# Patient Record
Sex: Female | Born: 1995 | Race: White | Hispanic: No | Marital: Single | Smoking: Never smoker
Health system: Southern US, Community
[De-identification: ages and names within clinical notes are randomized; demographics above are authoritative.]

## PROBLEM LIST (undated history)

## (undated) DIAGNOSIS — F419 Anxiety disorder, unspecified: Secondary | ICD-10-CM

---

## 2016-11-19 ENCOUNTER — Encounter: Payer: Self-pay | Admitting: Physician Assistant

## 2016-11-29 ENCOUNTER — Ambulatory Visit: Payer: Self-pay | Admitting: Physician Assistant

## 2016-12-06 ENCOUNTER — Ambulatory Visit (INDEPENDENT_AMBULATORY_CARE_PROVIDER_SITE_OTHER): Payer: BC Managed Care – PPO | Admitting: Gastroenterology

## 2016-12-06 ENCOUNTER — Encounter: Payer: Self-pay | Admitting: Gastroenterology

## 2016-12-06 VITALS — BP 133/81 | HR 75 | Temp 98.1°F | Ht 63.0 in | Wt 137.0 lb

## 2016-12-06 DIAGNOSIS — R140 Abdominal distension (gaseous): Secondary | ICD-10-CM | POA: Diagnosis not present

## 2016-12-06 DIAGNOSIS — R1011 Right upper quadrant pain: Secondary | ICD-10-CM | POA: Diagnosis not present

## 2016-12-06 DIAGNOSIS — K625 Hemorrhage of anus and rectum: Secondary | ICD-10-CM | POA: Diagnosis not present

## 2016-12-06 DIAGNOSIS — G8929 Other chronic pain: Secondary | ICD-10-CM

## 2016-12-06 NOTE — Patient Instructions (Signed)
Please refer below for you upcoming appointments. Call us if you have any questions or concerns

## 2016-12-06 NOTE — Progress Notes (Signed)
Gastroenterology Consultation  Referring Provider:     No ref. provider found Primary Care Physician:  No primary care provider on file. Primary Gastroenterologist:  Dr. [name] Reida Hem  Reason for Consultation:     Abdominal pain         HPI:   Beryle QuantRebeckah Eichenberger is a 20 y.o. y/o female referred for consultation & management  by Dr. Bonnetta BarryNo primary care provider on file..    Abdominal pain: Onset: She says that it has been onging for a month, began all of a sudden , when it occurs stays for a long time  Site :RUQ Radiation: radiates to RLQ Severity :5/10 Nature of pain: sharp in nature Aggravating factors: Usually worst after she eats, seconds after she eats,  Relieving factors :passing gas helps, bowel movement helps  Weight loss: lost 8 lbs over the past month, she says the "food goes right thorugh me" NSAID use: Was on alleve which she stopped 3-4 weeks back , till then 2 tablets a day every day for a week  PPI use :prilosec, has helped her pain , before meals , 20 mg  Gall bladder surgery: intact , ?mom had gall bladder issues  Frequency of bowel movements: since starting prilosec once daily and prior to that was 5 times a day right after her meals  Change in bowel movements: she saw blood in her stool , mixed in her stool 1 week back , great grandmother had a "bag" Relief with bowel movements: yes  Gas/Bloating/Abdominal distension: yes, abdominal distension , "appears pregnant".   Does not drink soda, no artificial sweetners, consumes a lot of pizza pockets , taco roles, lots of processed foods..Does notice more abdominal distension when she consumes processed foods. When she passes gas is very foul smelling .    Presently stool consistency of mashed potatoes.  History reviewed. No pertinent past medical history.  History reviewed. No pertinent surgical history.  Prior to Admission medications   Medication Sig Start Date End Date Taking? Authorizing Provider  FLUoxetine (PROZAC) 40  MG capsule  11/22/16  Yes Historical Provider, MD  omeprazole (PRILOSEC) 20 MG capsule Take 20 mg by mouth daily.   Yes Historical Provider, MD    Family History  Problem Relation Age of Onset  . Stroke Maternal Grandfather   . Healthy Mother   . Healthy Father      Social History  Substance Use Topics  . Smoking status: Never Smoker  . Smokeless tobacco: Never Used  . Alcohol use No    Allergies as of 12/06/2016  . (No Known Allergies)    Review of Systems:    All systems reviewed and negative except where noted in HPI.   Physical Exam:  BP 133/81   Pulse 75   Temp 98.1 F (36.7 C) (Oral)   Ht 5\' 3"  (1.6 m)   Wt 137 lb (62.1 kg)   BMI 24.27 kg/m  No LMP recorded. Psych:  Alert and cooperative. Normal mood and affect. General:   Alert,  Well-developed, well-nourished, pleasant and cooperative in NAD Head:  Normocephalic and atraumatic. Eyes:  Sclera clear, no icterus.   Conjunctiva pink. Ears:  Normal auditory acuity. Nose:  No deformity, discharge, or lesions. Mouth:  No deformity or lesions,oropharynx pink & moist. Neck:  Supple; no masses or thyromegaly. Lungs:  Respirations even and unlabored.  Clear throughout to auscultation.   No wheezes, crackles, or rhonchi. No acute distress. Heart:  Regular rate and rhythm; no murmurs, clicks, rubs,  or gallops. Abdomen:  Normal bowel sounds.  No bruits.  Soft, non-tender and non-distended without masses, hepatosplenomegaly or hernias noted.  No guarding or rebound tenderness.    Psych:  Alert and cooperative. Normal mood and affect.  Imaging Studies: No results found.  Assessment and Plan:   Beryle QuantRebeckah Trentham is a 20 y.o. y/o female has been referred for abdominal pain in her RUQ, she also mentions intermittent rectal bleeding , gas and bloating .   She has some relief with PPI. Differentials for pain are gall stones, trapping of gas from carbohydrate intolerance. Rectal bleeding could be from hemorroids  1. Abdominal  pain: Increase dose of Prilosec , RUQ USG, Will obtain EGD as pain not relieved completely with PPI  2. Rectal bleeding : colonoscopy   3. Gas/bloating : Decrease dietary consumption of foods rich in sugars and high fructose corn syrup, if all tests are negative will consider treating empirically for small bowel bacterial overgrowth sydrome. Check celiac panel and TSH   Follow up in 6 weeks   Dr [name] Demetre Monaco MD

## 2016-12-07 ENCOUNTER — Other Ambulatory Visit
Admission: RE | Admit: 2016-12-07 | Discharge: 2016-12-07 | Disposition: A | Discharge status: Home / Self Care | Payer: BC Managed Care – PPO | Source: Ambulatory Visit | Attending: Gastroenterology | Admitting: Gastroenterology

## 2016-12-07 ENCOUNTER — Telehealth: Payer: Self-pay

## 2016-12-07 DIAGNOSIS — R1011 Right upper quadrant pain: Secondary | ICD-10-CM | POA: Insufficient documentation

## 2016-12-07 DIAGNOSIS — G8929 Other chronic pain: Secondary | ICD-10-CM | POA: Insufficient documentation

## 2016-12-07 DIAGNOSIS — R140 Abdominal distension (gaseous): Secondary | ICD-10-CM | POA: Insufficient documentation

## 2016-12-07 LAB — TSH: TSH: 1.373 u[IU]/mL (ref 0.350–4.500)

## 2016-12-07 NOTE — Telephone Encounter (Signed)
Toniann FailWendy, Patients mother, is concerned because the EGD and Colonoscopy is going to cost her 2 house payments. She is wanting to know if this is necessary and what exactly you are looking for. She has requested that you contact her at [phone] to discuss this with her. Please let me know if there is anything I can do for you.

## 2016-12-08 NOTE — Telephone Encounter (Signed)
Pt's mother, Toniann FailWendy stated she can speak with you anytime tomorrow. I told her you would contact her late morning.

## 2016-12-08 NOTE — Telephone Encounter (Signed)
Called mother , went to [employer], advised to inform us what time she would be free tomorrow to take my call

## 2016-12-09 LAB — H. PYLORI ANTIGEN, STOOL: H. Pylori Stool Ag, Eia: NEGATIVE

## 2016-12-09 NOTE — Telephone Encounter (Signed)
Spoke to LinthicumWendy explained that rectal bleeding requires evaluation . I did offer assistance in terms of providing medical notes or a letter if it would help the insurance company . She says she will get in touch with the insurance and call us if needed.   At present she will go ahead with the procedures as scheduled.   Dr [name] Selia Wareing  Gastroenterology/Hepatology Pager: [phone]

## 2016-12-10 ENCOUNTER — Ambulatory Visit
Admission: RE | Admit: 2016-12-10 | Discharge: 2016-12-10 | Disposition: A | Discharge status: Home / Self Care | Payer: BC Managed Care – PPO | Source: Ambulatory Visit | Attending: Gastroenterology | Admitting: Gastroenterology

## 2016-12-10 DIAGNOSIS — G8929 Other chronic pain: Secondary | ICD-10-CM | POA: Insufficient documentation

## 2016-12-10 DIAGNOSIS — R1011 Right upper quadrant pain: Secondary | ICD-10-CM | POA: Insufficient documentation

## 2016-12-10 LAB — CELIAC DISEASE PANEL
Endomysial Ab, IgA: NEGATIVE
IgA: 126 mg/dL (ref 87–352)
Tissue Transglutaminase Ab, IgA: <2 U/mL (ref 0–3)

## 2016-12-14 ENCOUNTER — Telehealth: Payer: Self-pay | Admitting: Gastroenterology

## 2016-12-14 NOTE — Telephone Encounter (Signed)
Results from ultrasound

## 2016-12-14 NOTE — Telephone Encounter (Signed)
Patient notified of  of Ultrasound 12/10/16.  Normal exam.

## 2016-12-15 ENCOUNTER — Encounter: Payer: Self-pay | Admitting: *Deleted

## 2016-12-16 ENCOUNTER — Ambulatory Visit
Admission: RE | Admit: 2016-12-16 | Discharge: 2016-12-16 | Disposition: A | Discharge status: Home / Self Care | Payer: BC Managed Care – PPO | Source: Ambulatory Visit | Attending: Gastroenterology | Admitting: Gastroenterology

## 2016-12-16 ENCOUNTER — Ambulatory Visit: Payer: BC Managed Care – PPO | Admitting: Certified Registered Nurse Anesthetist

## 2016-12-16 ENCOUNTER — Encounter
Admission: RE | Disposition: A | Discharge status: Home / Self Care | Payer: Self-pay | Source: Ambulatory Visit | Attending: Gastroenterology

## 2016-12-16 ENCOUNTER — Encounter: Payer: Self-pay | Admitting: *Deleted

## 2016-12-16 DIAGNOSIS — F419 Anxiety disorder, unspecified: Secondary | ICD-10-CM | POA: Diagnosis not present

## 2016-12-16 DIAGNOSIS — K3189 Other diseases of stomach and duodenum: Secondary | ICD-10-CM | POA: Insufficient documentation

## 2016-12-16 DIAGNOSIS — K625 Hemorrhage of anus and rectum: Secondary | ICD-10-CM | POA: Diagnosis not present

## 2016-12-16 DIAGNOSIS — R109 Unspecified abdominal pain: Secondary | ICD-10-CM | POA: Diagnosis present

## 2016-12-16 DIAGNOSIS — K640 First degree hemorrhoids: Secondary | ICD-10-CM | POA: Diagnosis not present

## 2016-12-16 HISTORY — PX: COLONOSCOPY WITH PROPOFOL: SHX5780

## 2016-12-16 HISTORY — PX: ESOPHAGOGASTRODUODENOSCOPY (EGD) WITH PROPOFOL: SHX5813

## 2016-12-16 HISTORY — DX: Anxiety disorder, unspecified: F41.9

## 2016-12-16 LAB — POCT PREGNANCY, URINE: Preg Test, Ur: NEGATIVE

## 2016-12-16 SURGERY — COLONOSCOPY WITH PROPOFOL
Anesthesia: General

## 2016-12-16 MED ORDER — PROPOFOL 10 MG/ML IV BOLUS
INTRAVENOUS | Status: DC | PRN
  Administered 2016-12-16: 80 mg via INTRAVENOUS
  Administered 2016-12-16: 20 mg via INTRAVENOUS

## 2016-12-16 MED ORDER — LIDOCAINE HCL (CARDIAC) 20 MG/ML IV SOLN
INTRAVENOUS | Status: DC | PRN
  Administered 2016-12-16: 100 mg via INTRAVENOUS

## 2016-12-16 MED ORDER — PROPOFOL 500 MG/50ML IV EMUL
INTRAVENOUS | Status: AC
  Filled 2016-12-16: qty 50

## 2016-12-16 MED ORDER — SODIUM CHLORIDE 0.9 % IV SOLN
INTRAVENOUS | Status: DC
  Administered 2016-12-16: 08:00:00 via INTRAVENOUS

## 2016-12-16 MED ORDER — FENTANYL CITRATE (PF) 100 MCG/2ML IJ SOLN
INTRAMUSCULAR | Status: AC
  Filled 2016-12-16: qty 2

## 2016-12-16 MED ORDER — FENTANYL CITRATE (PF) 100 MCG/2ML IJ SOLN
INTRAMUSCULAR | Status: DC | PRN
  Administered 2016-12-16: 50 ug via INTRAVENOUS

## 2016-12-16 MED ORDER — PROPOFOL 500 MG/50ML IV EMUL
INTRAVENOUS | Status: DC | PRN
  Administered 2016-12-16: 140 ug/kg/min via INTRAVENOUS

## 2016-12-16 MED ORDER — LIDOCAINE 2% (20 MG/ML) 5 ML SYRINGE
INTRAMUSCULAR | Status: AC
  Filled 2016-12-16: qty 5

## 2016-12-16 NOTE — H&P (Signed)
  Anna Cook Trinity Hyland MD [address] [place], [place] [address] Phone: [phone] Fax : [phone]  Primary Care Physician:  Tresa ResJOHNSON,DAVID S, MD Primary Gastroenterologist:  Dr. Wyline Cook Makara Lanzo   Pre-Procedure History & Physical: HPI:  Anna Cook is a 20 y.o. female is here for an endoscopy and colonoscopy.   Past Medical History:  Diagnosis Date  . Anxiety     History reviewed. No pertinent surgical history.  Prior to Admission medications   Medication Sig Start Date End Date Taking? Authorizing Provider  omeprazole (PRILOSEC) 20 MG capsule Take 20 mg by mouth daily.   Yes Historical Provider, MD  Scarlett PrestoVIORELE 0.15-0.02/0.01 MG (21/5) tablet  10/14/16  Yes Historical Provider, MD  FLUoxetine (PROZAC) 40 MG capsule  11/22/16   Historical Provider, MD    Allergies as of 12/06/2016  . (No Known Allergies)    Family History  Problem Relation Age of Onset  . Stroke Maternal Grandfather   . Healthy Mother   . Healthy Father     Social History   Social History  . Marital status: Single    Spouse name: N/A  . Number of children: N/A  . Years of education: N/A   Occupational History  . Not on file.   Social History Main Topics  . Smoking status: Never Smoker  . Smokeless tobacco: Never Used  . Alcohol use No  . Drug use: No  . Sexual activity: Not on file   Other Topics Concern  . Not on file   Social History Narrative  . No narrative on file    Review of Systems: See HPI, otherwise negative ROS  Physical Exam: BP 130/73   Pulse (!) 102   Temp 98.4 F (36.9 C) (Oral)   Resp 14   Ht 5\' 3"  (1.6 m)   Wt 135 lb (61.2 kg)   SpO2 100%   BMI 23.91 kg/m  General:   Alert,  pleasant and cooperative in NAD Head:  Normocephalic and atraumatic. Neck:  Supple; no masses or thyromegaly. Lungs:  Clear throughout to auscultation.    Heart:  Regular rate and rhythm. Abdomen:  Soft, nontender and nondistended. Normal bowel sounds, without guarding, and without  rebound.   Neurologic:  Alert and  oriented x4;  grossly normal neurologically.  Impression/Plan: Anna QuantRebeckah Cook is here for an endoscopy and colonoscopy to be performed for abdominal pain,rectal bleeding, gas and bloating   Risks, benefits, limitations, and alternatives regarding  endoscopy and colonoscopy have been reviewed with the patient.  Questions have been answered.  All parties agreeable.   Anna Cook Bode Pieper, MD  12/16/2016, 8:45 AM

## 2016-12-16 NOTE — Op Note (Signed)
[facility] Gastroenterology Patient Name: Anna Cook Procedure Date: 12/16/2016 8:50 AM MRN: [id number] Account #: [id number] Date of Birth: July 09, 1996 Admit Type: Outpatient Age: 20 Room: [facility] ENDO ROOM 4 Gender: Female Note Status: Finalized Procedure:            Colonoscopy Indications:          Rectal bleeding Providers:            Wyline Mood MD, MD Referring MD:         Consuello Closs, MD (Referring MD) Medicines:            Monitored Anesthesia Care Complications:        No immediate complications. Procedure:            Pre-Anesthesia Assessment:                       - Prior to the procedure, a History and Physical was                        performed, and patient medications, allergies and                        sensitivities were reviewed. The patient's tolerance of                        previous anesthesia was reviewed.                       - The risks and benefits of the procedure and the                        sedation options and risks were discussed with the                        patient. All questions were answered and informed                        consent was obtained.                       - The risks and benefits of the procedure and the                        sedation options and risks were discussed with the                        patient. All questions were answered and informed                        consent was obtained.                       - ASA Grade Assessment: II - A patient with mild                        systemic disease.                       After obtaining informed consent, the colonoscope was                        passed  under direct vision. Throughout the procedure,                        the patient's blood pressure, pulse, and oxygen                        saturations were monitored continuously. The                        Colonoscope was introduced through the anus and                        advanced to the  the cecum, identified by the                        appendiceal orifice, IC valve and transillumination.                        The colonoscopy was performed with ease. The patient                        tolerated the procedure well. The quality of the bowel                        preparation was excellent. Findings:      The perianal and digital rectal examinations were normal.      Non-bleeding internal hemorrhoids were found during retroflexion. The       hemorrhoids were medium-sized and Grade I (internal hemorrhoids that do       not prolapse). Impression:           - Non-bleeding internal hemorrhoids.                       - No specimens collected. Recommendation:       - Patient has a contact number available for                        emergencies. The signs and symptoms of potential                        delayed complications were discussed with the patient.                        Return to normal activities tomorrow. Written discharge                        instructions were provided to the patient.                       - Resume previous diet.                       - Continue present medications.                       - Repeat colonoscopy at age 20 for screening purposes.                       - Discharge patient to home (with escort).                       -  Return to my office as previously scheduled. Procedure Code(s):    --- Professional ---                       [id number], Colonoscopy, flexible; diagnostic, including                        collection of specimen(s) by brushing or washing, when                        performed (separate procedure) Diagnosis Code(s):    --- Professional ---                       K64.0, First degree hemorrhoids                       K62.5, Hemorrhage of anus and rectum CPT copyright 2016 American Medical Association. All rights reserved. The codes documented in this report are preliminary and upon coder review may  be revised to meet current  compliance requirements. [name] Raynor Calcaterra, MD [name] Tyla Burgner MD, MD 12/16/2016 9:21:29 AM This report has been signed electronically. Number of Addenda: 0 Note Initiated On: 12/16/2016 8:50 AM Total Procedure Duration: 0 hours 13 minutes 30 seconds       [facility]

## 2016-12-16 NOTE — Anesthesia Preprocedure Evaluation (Signed)
Anesthesia Evaluation  Patient identified by MRN, date of birth, ID band Patient awake    Reviewed: Allergy & Precautions, NPO status , Patient's Chart, lab work & pertinent test results  Airway Mallampati: II  TM Distance: >3 FB     Dental   Pulmonary neg pulmonary ROS,    Pulmonary exam normal        Cardiovascular negative cardio ROS Normal cardiovascular exam     Neuro/Psych Anxiety negative neurological ROS     GI/Hepatic negative GI ROS, Neg liver ROS,   Endo/Other  negative endocrine ROS  Renal/GU negative Renal ROS  negative genitourinary   Musculoskeletal negative musculoskeletal ROS (+)   Abdominal Normal abdominal exam  (+)   Peds negative pediatric ROS (+)  Hematology negative hematology ROS (+)   Anesthesia Other Findings   Reproductive/Obstetrics                             Anesthesia Physical Anesthesia Plan  ASA: II  Anesthesia Plan: General   Post-op Pain Management:    Induction: Intravenous  Airway Management Planned: Nasal Cannula  Additional Equipment:   Intra-op Plan:   Post-operative Plan:   Informed Consent: I have reviewed the patients History and Physical, chart, labs and discussed the procedure including the risks, benefits and alternatives for the proposed anesthesia with the patient or authorized representative who has indicated his/her understanding and acceptance.   Dental advisory given  Plan Discussed with: CRNA and Surgeon  Anesthesia Plan Comments:         Anesthesia Quick Evaluation

## 2016-12-16 NOTE — Transfer of Care (Signed)
Immediate Anesthesia Transfer of Care Note  Patient: Anna Cook  Procedure(s) Performed: Procedure(s): COLONOSCOPY WITH PROPOFOL (N/A) ESOPHAGOGASTRODUODENOSCOPY (EGD) WITH PROPOFOL (N/A)  Patient Location: PACU  Anesthesia Type:General  Level of Consciousness: sedated  Airway & Oxygen Therapy: Patient Spontanous Breathing and Patient connected to nasal cannula oxygen  Post-op Assessment: Report given to RN and Post -op Vital signs reviewed and stable  Post vital signs: Reviewed and stable  Last Vitals:  Vitals:   12/16/16 0817  BP: 130/73  Pulse: (!) 102  Resp: 14  Temp: 36.9 C    Last Pain:  Vitals:   12/16/16 0817  TempSrc: Oral         Complications: No apparent anesthesia complications

## 2016-12-16 NOTE — Anesthesia Procedure Notes (Signed)
Performed by: Derian Dimalanta Pre-anesthesia Checklist: Patient identified, Emergency Drugs available, Suction available, Patient being monitored and Timeout performed Patient Re-evaluated:Patient Re-evaluated prior to inductionOxygen Delivery Method: Nasal cannula Intubation Type: IV induction       

## 2016-12-16 NOTE — Op Note (Signed)
[facility] Gastroenterology Patient Name: Anna Cook Procedure Date: 12/16/2016 8:48 AM MRN: [id number] Account #: [id number] Date of Birth: 11/21/1996 Admit Type: Outpatient Age: [age] [facility] ENDO ROOM 4 Gender: Female Note Status: Finalized Procedure:            Upper GI endoscopy Indications:          Abdominal pain Providers:            [name] Dhana Totton MD, MD Referring MD:         Consuello Clossonald Johnson, MD (Referring MD) Medicines:            Monitored Anesthesia Care Complications:        No immediate complications. Procedure:            Pre-Anesthesia Assessment:                       - Prior to the procedure, a History and Physical was                        performed, and patient medications, allergies and                        sensitivities were reviewed. The patient's tolerance of                        previous anesthesia was reviewed.                       - The risks and benefits of the procedure and the                        sedation options and risks were discussed with the                        patient. All questions were answered and informed                        consent was obtained.                       - The risks and benefits of the procedure and the                        sedation options and risks were discussed with the                        patient. All questions were answered and informed                        consent was obtained.                       - ASA Grade Assessment: II - A patient with mild                        systemic disease.                       After obtaining informed consent, the endoscope was  passed under direct vision. Throughout the procedure,                        the patient's blood pressure, pulse, and oxygen                        saturations were monitored continuously. The Endoscope                        was introduced through the mouth, and advanced to the    third part of duodenum. The upper GI endoscopy was                        accomplished with ease. The patient tolerated the                        procedure well. Findings:      The examined duodenum was normal.      The esophagus was normal.      Localized mild inflammation characterized by congestion (edema) was       found in the prepyloric region of the stomach. Biopsies were taken with       a cold forceps for histology.      The examined duodenum was normal. Biopsies for histology were taken with       a cold forceps for evaluation of celiac disease. Impression:           - Normal examined duodenum.                       - Normal esophagus.                       - Gastritis. Biopsied.                       - Normal examined duodenum. Biopsied. Recommendation:       - Patient has a contact number available for                        emergencies. The signs and symptoms of potential                        delayed complications were discussed with the patient.                        Return to normal activities tomorrow. Written discharge                        instructions were provided to the patient.                       - Resume previous diet.                       - Continue present medications.                       - Await pathology results.                       - No repeat upper endoscopy.                       -  Return to GI office as previously scheduled. Procedure Code(s):    --- Professional ---                       [phone], Esophagogastroduodenoscopy, flexible, transoral;                        with biopsy, single or multiple Diagnosis Code(s):    --- Professional ---                       K29.70, Gastritis, unspecified, without bleeding                       R10.9, Unspecified abdominal pain CPT copyright 2016 American Medical Association. All rights reserved. The codes documented in this report are preliminary and upon coder review may  be revised to meet current  compliance requirements. Wyline Mood, MD Wyline Mood MD, MD 12/16/2016 9:22:07 AM This report has been signed electronically. Number of Addenda: 0 Note Initiated On: 12/16/2016 8:48 AM      [facility]

## 2016-12-16 NOTE — Anesthesia Postprocedure Evaluation (Signed)
Anesthesia Post Note  Patient: Anna Cook  Procedure(s) Performed: Procedure(s) (LRB): COLONOSCOPY WITH PROPOFOL (N/A) ESOPHAGOGASTRODUODENOSCOPY (EGD) WITH PROPOFOL (N/A)  Patient location during evaluation: PACU Anesthesia Type: General Level of consciousness: awake and alert and oriented Pain management: pain level controlled Vital Signs Assessment: post-procedure vital signs reviewed and stable Respiratory status: spontaneous breathing Cardiovascular status: blood pressure returned to baseline Anesthetic complications: no     Last Vitals:  Vitals:   12/16/16 0930 12/16/16 0940  BP: (!) 98/46 107/66  Pulse: 66 69  Resp: 14 12  Temp:      Last Pain:  Vitals:   12/16/16 0920  TempSrc: Tympanic                 Carie Kapuscinski

## 2016-12-17 ENCOUNTER — Encounter: Payer: Self-pay | Admitting: Gastroenterology

## 2016-12-17 LAB — SURGICAL PATHOLOGY

## 2016-12-23 ENCOUNTER — Encounter: Payer: Self-pay | Admitting: Gastroenterology

## 2017-01-03 ENCOUNTER — Other Ambulatory Visit: Payer: Self-pay | Admitting: *Deleted

## 2017-01-10 ENCOUNTER — Ambulatory Visit: Payer: BC Managed Care – PPO | Admitting: Gastroenterology

## 2018-12-05 ENCOUNTER — Ambulatory Visit: Payer: BC Managed Care – PPO | Admitting: Family Medicine

## 2019-12-26 ENCOUNTER — Ambulatory Visit: Payer: BC Managed Care – PPO | Attending: Internal Medicine

## 2019-12-26 DIAGNOSIS — Z20822 Contact with and (suspected) exposure to covid-19: Secondary | ICD-10-CM

## 2019-12-27 LAB — NOVEL CORONAVIRUS, NAA: SARS-CoV-2, NAA: NOT DETECTED

## 2020-09-02 ENCOUNTER — Encounter: Payer: Self-pay | Admitting: Family Medicine

## 2020-10-27 DIAGNOSIS — L503 Dermatographic urticaria: Secondary | ICD-10-CM | POA: Insufficient documentation

## 2020-10-27 DIAGNOSIS — F418 Other specified anxiety disorders: Secondary | ICD-10-CM | POA: Insufficient documentation

## 2020-11-03 ENCOUNTER — Other Ambulatory Visit: Payer: Self-pay | Admitting: Internal Medicine

## 2020-11-03 DIAGNOSIS — M5412 Radiculopathy, cervical region: Secondary | ICD-10-CM

## 2020-11-12 ENCOUNTER — Other Ambulatory Visit: Payer: Self-pay

## 2020-11-12 ENCOUNTER — Ambulatory Visit
Admission: RE | Admit: 2020-11-12 | Discharge: 2020-11-12 | Disposition: A | Discharge status: Home / Self Care | Payer: BC Managed Care – PPO | Source: Ambulatory Visit | Attending: Internal Medicine | Admitting: Internal Medicine

## 2020-11-12 ENCOUNTER — Encounter: Payer: Self-pay | Admitting: Radiology

## 2020-11-12 DIAGNOSIS — M5412 Radiculopathy, cervical region: Secondary | ICD-10-CM | POA: Insufficient documentation

## 2021-08-06 IMAGING — MR MR CERVICAL SPINE W/O CM
5 series · 39 of 48 positions shown · non-contrast
Comparison: Prior radiograph from 10/29/2020

CLINICAL DATA: Initial evaluation for neck pain with radicular pain
to right shoulder and fingers, finger numbness.

EXAM:
MRI CERVICAL SPINE WITHOUT CONTRAST
TECHNIQUE: Multiplanar, multisequence MR imaging of the cervical spine was
performed. No intravenous contrast was administered.

[Series 5: T2 · sagittal · 3.0mm · 0.62mm/px · 6 of 15 slices shown (1 of 2)]
[im 1/15]
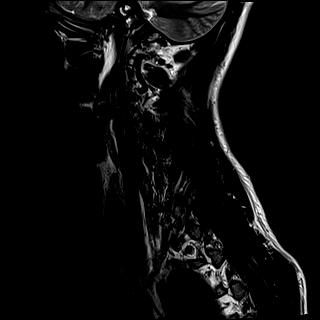
[im 3/15]
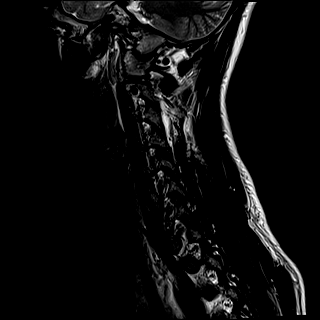
[im 6/15]
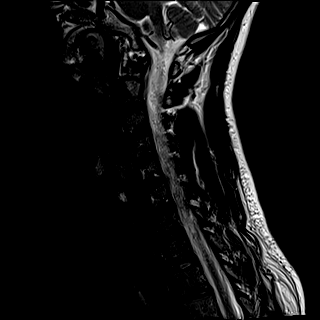
[im 9/15]
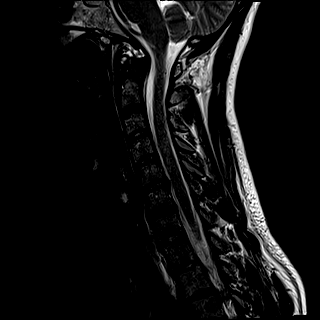
[im 12/15]
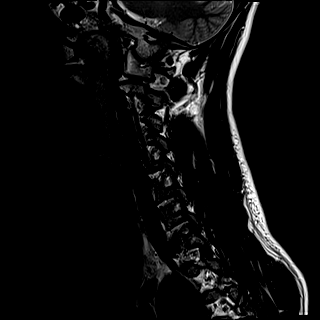
[im 15/15]
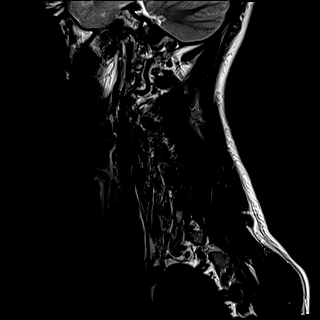

[Series 6: FLAIR · sagittal · 3.0mm · 0.78mm/px · 7 of 15 slices shown]
[im 1/15]
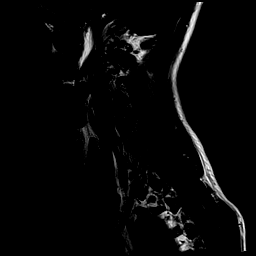
[im 3/15]
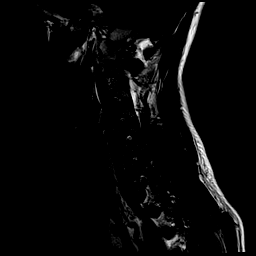
[im 5/15]
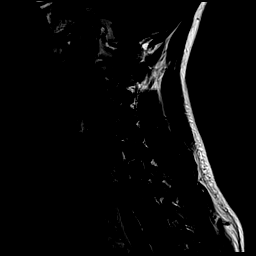
[im 8/15]
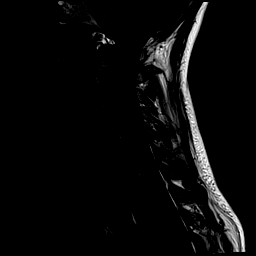
[im 10/15]
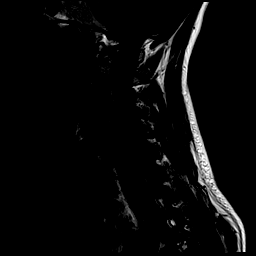
[im 12/15]
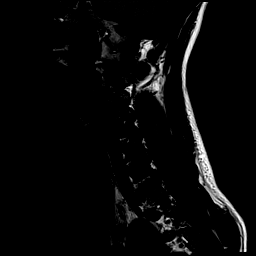
[im 15/15]
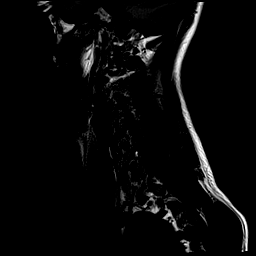

[Series 7: STIR · sagittal · 3.0mm · 0.62mm/px · 7 of 15 slices shown]
[im 1/15]
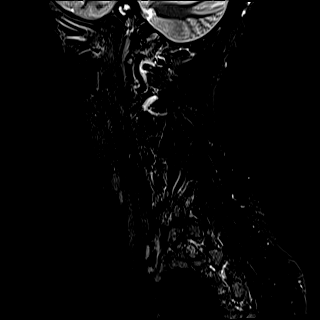
[im 3/15]
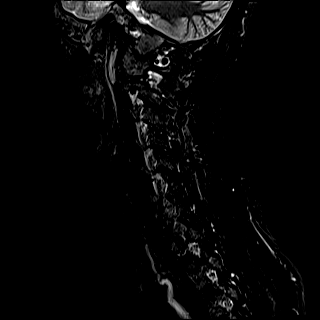
[im 5/15]
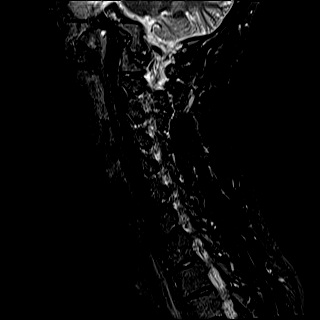
[im 8/15]
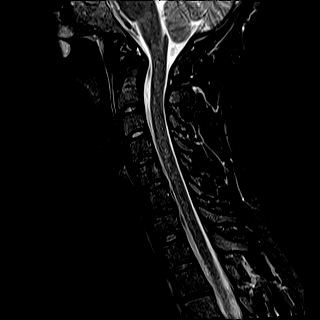
[im 10/15]
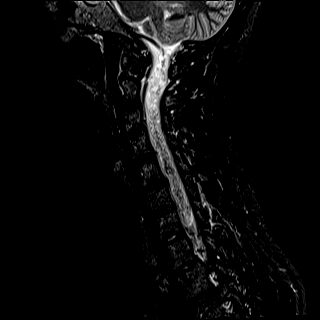
[im 12/15]
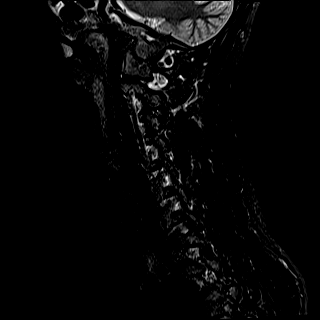
[im 15/15]
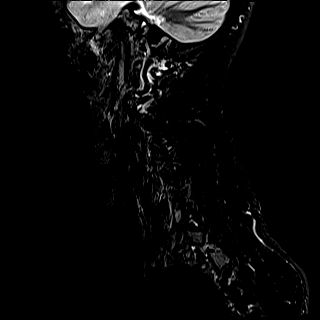

[Series 8: T2 · axial · 3.0mm · 0.70mm/px · z∈[-66,+30]mm · 11 of 29 slices shown (2 of 2)]
[im 1/29]
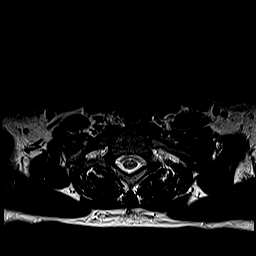
[im 3/29]
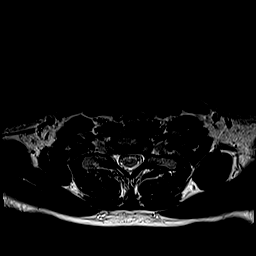
[im 5/29]
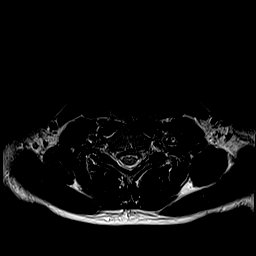
[im 7/29]
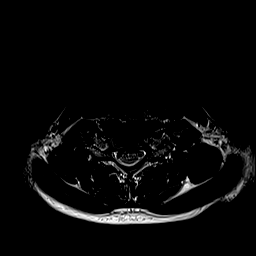
[im 9/29]
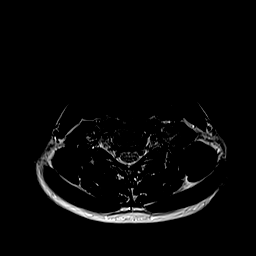
[im 11/29]
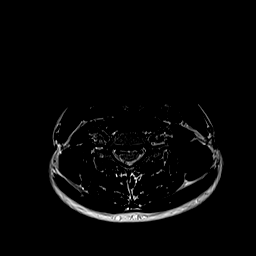
[im 13/29]
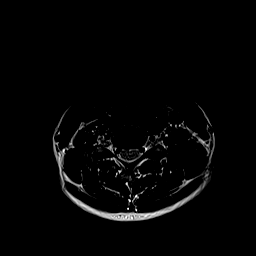
[im 16/29]
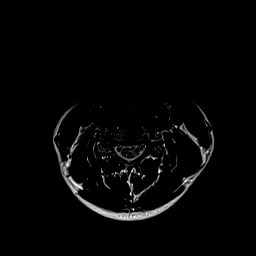
[im 20/29]
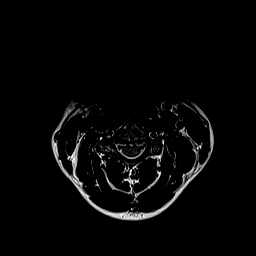
[im 24/29]
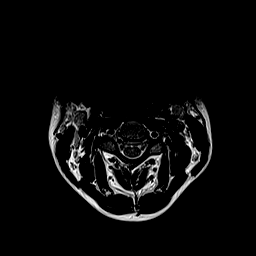
[im 29/29]
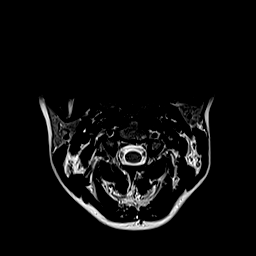

[Series 9: ax mpgr · axial · 3.0mm · 0.35mm/px · z∈[-66,+30]mm · 8 of 29 slices shown]
[im 1/29]
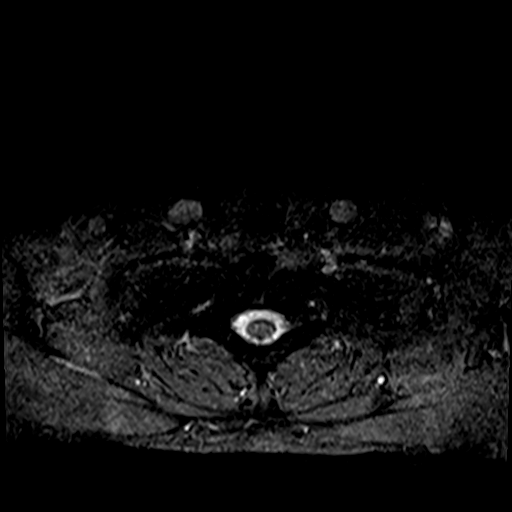
[im 5/29]
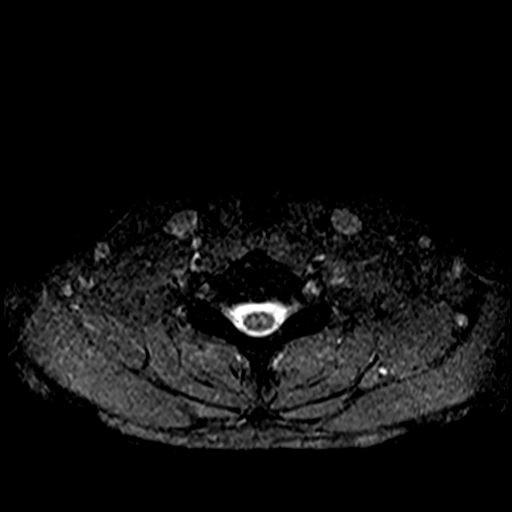
[im 9/29]
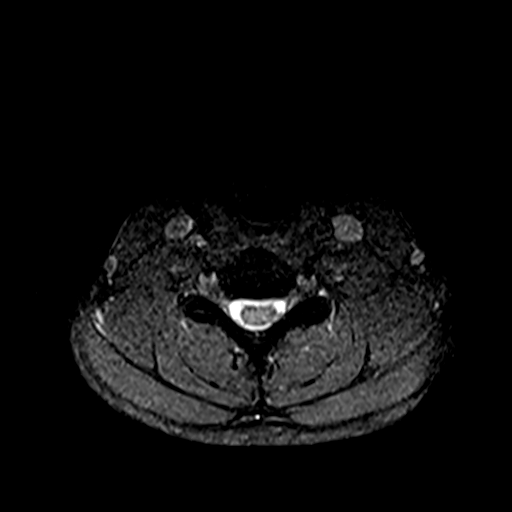
[im 13/29]
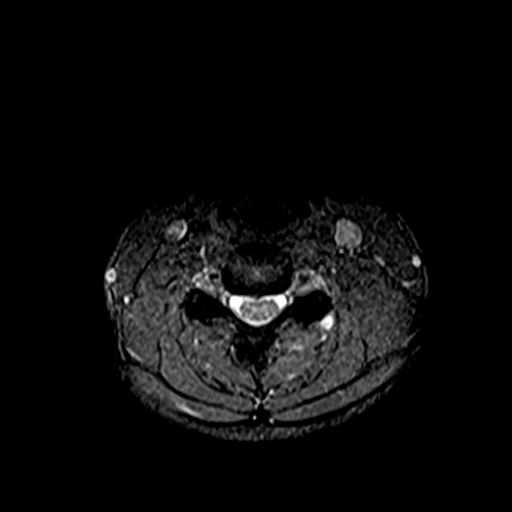
[im 16/29]
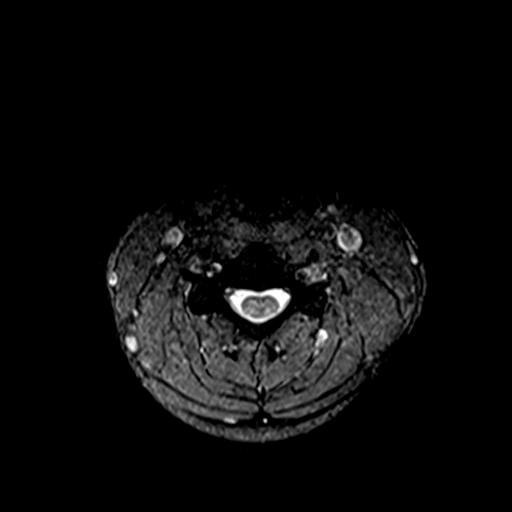
[im 20/29]
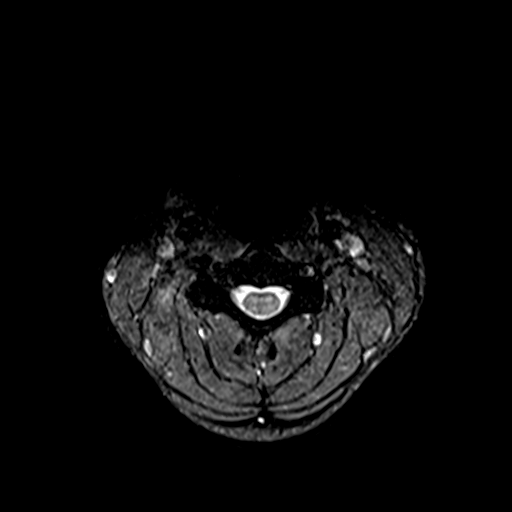
[im 24/29]
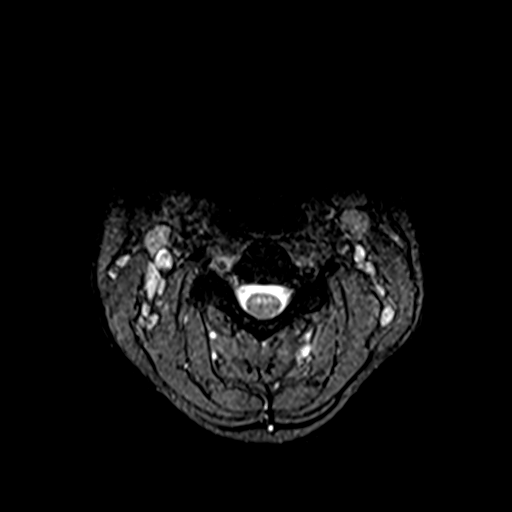
[im 29/29]
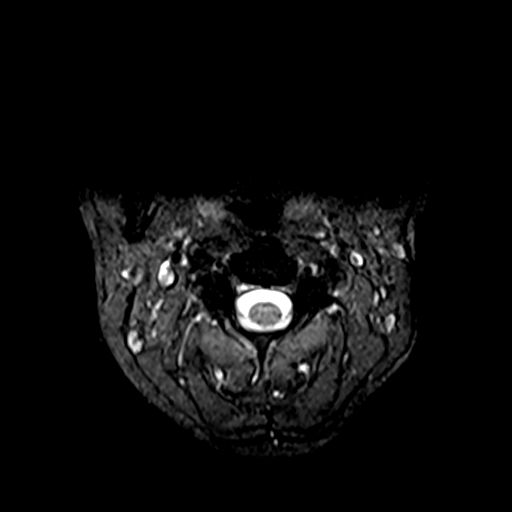

[39 of 48 positions shown; findings below may reference images not displayed]

FINDINGS: Alignment: Straightening of the normal cervical lordosis. No
listhesis.

Vertebrae: Vertebral body height maintained without acute or chronic
fracture. Bone marrow signal intensity within normal limits. No
discrete or worrisome osseous lesions. No abnormal marrow edema.

Cord: Normal signal and morphology.

Posterior Fossa, vertebral arteries, paraspinal tissues: Visualized
brain and posterior fossa within normal limits. Craniocervical
junction normal. Paraspinous and prevertebral soft tissues within
normal limits. Normal intravascular flow voids seen within the
vertebral arteries bilaterally.

Disc levels:

C2-C3: Unremarkable.

C3-C4:  Unremarkable.

C4-C5: Disc desiccation without disc bulge. No canal or foraminal
stenosis.

C5-C6: Disc desiccation with mild disc bulge. Superimposed tiny
right central disc protrusion indents the ventral thecal sac (series
9, image 17). Minimal flattening of the right ventral cord without
significant spinal stenosis. Foramina remain patent.

C6-C7: Disc desiccation with mild disc bulge. Associated tiny
central annular fissure. No significant spinal stenosis or cord
deformity. Foramina remain patent.

C7-T1:  Unremarkable.

Visualized upper thoracic spine demonstrates no significant finding.
IMPRESSION: 1. Tiny right central disc protrusion at C5-6, which could
potentially affect the ventral right C6 nerve root.
2. Mild noncompressive disc bulging at C6-7 without significant
stenosis or cord deformity.

## 2022-03-31 ENCOUNTER — Ambulatory Visit: Payer: 59 | Admitting: Family Medicine

## 2022-03-31 ENCOUNTER — Encounter: Payer: Self-pay | Admitting: Family Medicine

## 2022-03-31 VITALS — BP 110/72 | HR 74 | Ht 63.0 in | Wt 151.0 lb

## 2022-03-31 DIAGNOSIS — Z124 Encounter for screening for malignant neoplasm of cervix: Secondary | ICD-10-CM

## 2022-03-31 DIAGNOSIS — M222X2 Patellofemoral disorders, left knee: Secondary | ICD-10-CM

## 2022-03-31 DIAGNOSIS — M222X1 Patellofemoral disorders, right knee: Secondary | ICD-10-CM

## 2022-03-31 DIAGNOSIS — F418 Other specified anxiety disorders: Secondary | ICD-10-CM

## 2022-03-31 MED ORDER — DULOXETINE HCL 30 MG PO CPEP: ORAL_CAPSULE | ORAL | 0 refills | Status: DC

## 2022-03-31 NOTE — Patient Instructions (Addendum)
-   Start duloxetine 30 mg (1 capsule) daily x2 weeks ?- After 2 weeks increase to 60 mg (2 capsules) daily until follow-up ?- Perform home exercises for the knees ?-Referral coordinator will contact you in regards to OB/GYN scheduling ?- Return for follow-up in 6 weeks ?-Reach out for any questions between now and then ?

## 2022-03-31 NOTE — Progress Notes (Signed)
?  ? ?  Primary Care / Sports Medicine Office Visit ? ?Patient Information:  ?Patient ID: Anna Cook, female DOB: 1996-09-16 Age: 26 y.o. MRN: [id number]  ? ?Anna Cook is a pleasant 26 y.o. female presenting with the following: ? ?Chief Complaint  ?Patient presents with  ? Establish Care  ? Medication Refill  ?  Pt has been on Prozac for years was changed to Buspar and hasn't taking that, would like something else for anxiety.   ? ? ?Vitals:  ? 03/31/22 0918  ?BP: 110/72  ?Pulse: 74  ?SpO2: 99%  ? ?Vitals:  ? 03/31/22 0918  ?Weight: 151 lb (68.5 kg)  ?Height: 5\' 3"  (1.6 m)  ? ?Body mass index is 26.75 kg/m?. ? ?No results found.  ? ?Independent interpretation of notes and tests performed by another provider:  ? ?None ? ?Procedures performed:  ? ?None ? ?Pertinent History, Exam, Impression, and Recommendations:  ? ?Anxiety with depression ?Patient with longstanding history of anxiety with bouts of depressive episodes, previously treated since childhood with Prozac.  She does state that Prozac was beneficial for many years but had been less efficacious over the past several months, no additional attributable change that coincided with this.  She was started on buspirone as adjunct with a goal to bridge to buspirone monotherapy.  Per record review this was initially beneficial however patient reports limited to no response while on that treatment.  Plan was to further titrate but, due to insurance change, patient had to transition care.  She has been off of both medications for several weeks. ? ?Her primary symptoms related are bouts of crying, prolonged sleep, anxious throughout the day.  She does have comorbid chronic patellofemoral pain.  As such, we reviewed pharmacologic and nonpharmacologic treatment strategies.  She is amenable to trial of duloxetine at 30 mg x 2 weeks then escalation to 60 mg until her return in 6 weeks. ? ?Chronic condition, symptomatic, Rx management ? ?Patellofemoral syndrome,  bilateral ?Patient also brings up bilateral patellar tendon tears, complete on right, partial left, treated nonsurgically.  She still has issues with her patellae during her regular workout sessions, at times pain can radiate to the mid-distal quadriceps, associated with clicking.  Plan for dedicated home-based exercises with a focus on patellar stabilization, adjunct pharmacotherapy with the duloxetine, and reevaluation in 6 weeks. ? ?Chronic condition, symptomatic, Rx management  ? ?Orders & Medications ?Meds ordered this encounter  ?Medications  ? DULoxetine (CYMBALTA) 30 MG capsule  ?  Sig: Take 1 capsule (30 mg total) by mouth every evening for 14 days, THEN 2 capsules (60 mg total) every evening.  ?  Dispense:  74 capsule  ?  Refill:  0  ? ?Orders Placed This Encounter  ?Procedures  ? Ambulatory referral to Gynecology  ?  ? ?Return in about 6 weeks (around 05/12/2022).  ?  ? ?05/14/2022, MD ? ? Primary Care Sports Medicine ?[facility] ?[facility]  ? ?

## 2022-03-31 NOTE — Assessment & Plan Note (Signed)
Patient also brings up bilateral patellar tendon tears, complete on right, partial left, treated nonsurgically.  She still has issues with her patellae during her regular workout sessions, at times pain can radiate to the mid-distal quadriceps, associated with clicking.  Plan for dedicated home-based exercises with a focus on patellar stabilization, adjunct pharmacotherapy with the duloxetine, and reevaluation in 6 weeks. ? ?Chronic condition, symptomatic, Rx management ?

## 2022-03-31 NOTE — Assessment & Plan Note (Signed)
Patient with longstanding history of anxiety with bouts of depressive episodes, previously treated since childhood with Prozac.  She does state that Prozac was beneficial for many years but had been less efficacious over the past several months, no additional attributable change that coincided with this.  She was started on buspirone as adjunct with a goal to bridge to buspirone monotherapy.  Per record review this was initially beneficial however patient reports limited to no response while on that treatment.  Plan was to further titrate but, due to insurance change, patient had to transition care.  She has been off of both medications for several weeks. ? ?Her primary symptoms related are bouts of crying, prolonged sleep, anxious throughout the day.  She does have comorbid chronic patellofemoral pain.  As such, we reviewed pharmacologic and nonpharmacologic treatment strategies.  She is amenable to trial of duloxetine at 30 mg x 2 weeks then escalation to 60 mg until her return in 6 weeks. ? ?Chronic condition, symptomatic, Rx management ?

## 2022-05-03 ENCOUNTER — Encounter: Payer: 59 | Admitting: Licensed Practical Nurse

## 2022-05-12 ENCOUNTER — Ambulatory Visit: Payer: Self-pay | Admitting: Family Medicine

## 2022-05-18 ENCOUNTER — Other Ambulatory Visit: Payer: Self-pay

## 2022-05-18 ENCOUNTER — Encounter: Payer: Self-pay | Admitting: Licensed Practical Nurse

## 2022-05-18 ENCOUNTER — Ambulatory Visit (INDEPENDENT_AMBULATORY_CARE_PROVIDER_SITE_OTHER): Payer: 59 | Admitting: Licensed Practical Nurse

## 2022-05-18 ENCOUNTER — Other Ambulatory Visit (HOSPITAL_COMMUNITY)
Admission: RE | Admit: 2022-05-18 | Discharge: 2022-05-18 | Disposition: A | Discharge status: Home / Self Care | Payer: 59 | Source: Ambulatory Visit | Attending: Licensed Practical Nurse | Admitting: Licensed Practical Nurse

## 2022-05-18 VITALS — BP 118/62 | Ht 62.0 in | Wt 148.0 lb

## 2022-05-18 DIAGNOSIS — Z01419 Encounter for gynecological examination (general) (routine) without abnormal findings: Secondary | ICD-10-CM | POA: Insufficient documentation

## 2022-05-18 DIAGNOSIS — Z124 Encounter for screening for malignant neoplasm of cervix: Secondary | ICD-10-CM

## 2022-05-18 DIAGNOSIS — F418 Other specified anxiety disorders: Secondary | ICD-10-CM

## 2022-05-18 DIAGNOSIS — Z113 Encounter for screening for infections with a predominantly sexual mode of transmission: Secondary | ICD-10-CM

## 2022-05-18 MED ORDER — DULOXETINE HCL 60 MG PO CPEP: 60.0000 mg | ORAL_CAPSULE | Freq: Every day | ORAL | 0 refills | Status: DC

## 2022-05-18 NOTE — Progress Notes (Signed)
Gynecology Annual Exam  PCP: [name], MD  Chief Complaint:  Chief Complaint  Patient presents with   Gynecologic Exam    History of Present Illness: Patient is a 26 y.o. G1P0 presents for annual exam. The patient reports having an anal fissure and was told she  could have it removed.  Las saw her PCP 2 months ago, had STI screening then.  Was told she should start seeing an OB GYN.   LMP: Patient's last menstrual period was 04/14/2022 (approximate).  Average Interval: regular, monthly Duration of flow:  3-4  days Heavy Menses: no Clots: no Intermenstrual Bleeding: no Postcoital Bleeding: no Dysmenorrhea: no  The patient is sexually active with one female partner. She currently uses OCP (estrogen/progesterone) for contraception. She denies dyspareunia.  The patient does not perform self breast exams.  There is no notable family history of breast or ovarian cancer in her family.  The patient wears seatbelts: yes.  The patient has regular exercise: yes.  Works as a Hydrographic surveyor.   The patient denies current symptoms of depression.  Is currently being treated for anxiety  Pt lives by herself Dental exam up to date   Review of Systems: Review of Systems  Constitutional: Negative.   Respiratory: Negative.    Cardiovascular: Negative.   Gastrointestinal: Negative.   Genitourinary: Negative.   Musculoskeletal: Negative.   Neurological: Negative.   Endo/Heme/Allergies:        Sneezing   Psychiatric/Behavioral: Negative.     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 Contraception: OCP (estrogen/progesterone) Last Pap: Results were: has had pelvic exams and paps in the past, unsure of date.  Normal results.   Obstetric History: G1P0  Family History:  Family History  Problem Relation Age of Onset   Healthy Mother    Hypertension Father    Healthy Father    Hypertension Maternal Grandfather    Stroke Paternal Grandmother     Social History:  Social History   Socioeconomic History   Marital status: Single    Spouse name: Not on file   Number of children: Not on file   Years of education: Not on file   Highest education level: Not on file  Occupational History   Not on file  Tobacco Use   Smoking status: Never   Smokeless tobacco: Never  Vaping Use   Vaping Use: Never used  Substance and Sexual Activity   Alcohol use: No   Drug use: No   Sexual activity: Yes    Birth control/protection: Pill  Other Topics Concern   Not on file  Social History Narrative   Not on file   Social Determinants of Health   Financial Resource Strain: Not on file  Food Insecurity: Not on file  Transportation Needs: Not on file  Physical Activity: Not on file  Stress: Not on  file  Social Connections: Not on file  Intimate Partner Violence: Not on file    Allergies:  Allergies  Allergen Reactions   Penicillins Rash and Hives   Cefdinir Other (See Comments) and Hives   Cephalosporins Rash    Medications: Prior to Admission medications   Medication Sig Start Date End Date Taking? Authorizing Provider  cetirizine (ZYRTEC) 10 MG tablet daily.   Yes [provider]  mometasone (NASONEX) 50 MCG/ACT nasal spray mometasone 50  mcg/actuation nasal spray  2 SPRAYS EACH NOSTRIL ONCE A DAY. LOOK AT TOES AND POINT TOWARDS EARS.   Yes [provider]  DULoxetine (CYMBALTA) 30 MG capsule Take 1 capsule (30 mg total) by mouth every evening for 14 days, THEN 2 capsules (60 mg total) every evening. 03/31/22 05/14/22  Jerrol BananaMatthews, Jason J, MD  omeprazole (PRILOSEC) 20 MG capsule Take 20 mg by mouth daily. Patient not taking: Reported on 05/18/2022    [provider]  Scarlett PrestoVIORELE 0.15-0.02/0.01 MG (21/5) tablet  10/14/16   [provider]    Physical Exam Vitals: Blood pressure 118/62, height 5\' 2"  (1.575 m), weight 148 lb (67.1 kg), last menstrual period 04/14/2022, unknown if currently breastfeeding.  General: NAD HEENT: normocephalic, anicteric Thyroid: no enlargement, no palpable nodules Pulmonary: No increased work of breathing, CTAB Cardiovascular: RRR, distal pulses 2+ Breast: Breast symmetrical, no tenderness, no palpable nodules or masses, no skin or nipple retraction present, no nipple discharge.  No axillary or supraclavicular lymphadenopathy. Abdomen: NABS, soft, non-tender, non-distended.  Umbilicus without lesions.  No hepatomegaly, splenomegaly or masses palpable. No evidence of hernia  Genitourinary:  External: Normal external female genitalia.  Normal urethral meatus, normal Bartholin's and Skene's glands.  Hemorrhoid present.   Vagina: Normal vaginal mucosa, no evidence of prolapse.    Cervix: Grossly normal in appearance, no bleeding  Uterus: Non-enlarged, mobile, normal contour.  No CMT  Adnexa: ovaries non-enlarged, no adnexal masses  Rectal: deferred  Lymphatic: no evidence of inguinal lymphadenopathy Extremities: no edema, erythema, or tenderness Neurologic: Grossly intact Psychiatric: mood appropriate, affect full  Female chaperone present for pelvic and breast  portions of the physical exam    Assessment: 26 y.o. G1P0 routine annual exam  Plan: Problem List Items Addressed  This Visit   None Visit Diagnoses     Well woman exam    -  Primary   Relevant Orders   Cytology - PAP   Cervical cancer screening       Relevant Orders   Cytology - PAP   Screening examination for venereal disease       Relevant Orders   Cytology - PAP       1) 4) Gardasil Series discussed and if applicable offered to patient - Patient was not discussed at this visit previously completed 3 shot series   2) STI screening  wasoffered and declined had recent testing and was called  and told "everything negative", results not on care everywhere, pt's mychart does not show results, will contact her PCP and have additional testing if needed.   3)  ASCCP guidelines and rational discussed.  Patient opts for every 3 years screening interval  4) Contraception - the patient is currently using  oral progesterone-only contraceptive.  She is happy with her current form of contraception and plans to continue We discussed safe sex practices to reduce her furture risk of STI's.    5) Return in about 1 year (around 05/19/2023).  6) Hemorrhoid comfort measures reviewed, consider seeing proctologist  if it gets worse.    Carie Caddy, CNM  [facility], [facility] 05/18/2022, 9:57 AM

## 2022-05-20 LAB — CYTOLOGY - PAP
Adequacy: ABSENT
Chlamydia: NEGATIVE
Comment: NEGATIVE
Comment: NEGATIVE
Comment: NORMAL
Diagnosis: NEGATIVE
Neisseria Gonorrhea: NEGATIVE
Trichomonas: NEGATIVE

## 2022-06-13 ENCOUNTER — Encounter: Payer: Self-pay | Admitting: Family Medicine

## 2022-06-23 ENCOUNTER — Ambulatory Visit: Payer: Self-pay | Admitting: Family Medicine

## 2022-07-27 ENCOUNTER — Ambulatory Visit: Payer: Self-pay | Admitting: *Deleted

## 2022-07-27 ENCOUNTER — Encounter: Payer: Self-pay | Admitting: Family Medicine

## 2022-07-27 ENCOUNTER — Ambulatory Visit (INDEPENDENT_AMBULATORY_CARE_PROVIDER_SITE_OTHER): Payer: 59 | Admitting: Family Medicine

## 2022-07-27 VITALS — BP 116/76 | HR 76 | Ht 63.0 in | Wt 152.0 lb

## 2022-07-27 DIAGNOSIS — R7989 Other specified abnormal findings of blood chemistry: Secondary | ICD-10-CM

## 2022-07-27 DIAGNOSIS — M7521 Bicipital tendinitis, right shoulder: Secondary | ICD-10-CM

## 2022-07-27 DIAGNOSIS — R109 Unspecified abdominal pain: Secondary | ICD-10-CM

## 2022-07-27 DIAGNOSIS — Z114 Encounter for screening for human immunodeficiency virus [HIV]: Secondary | ICD-10-CM

## 2022-07-27 DIAGNOSIS — Z1159 Encounter for screening for other viral diseases: Secondary | ICD-10-CM

## 2022-07-27 DIAGNOSIS — F418 Other specified anxiety disorders: Secondary | ICD-10-CM

## 2022-07-27 DIAGNOSIS — Z13220 Encounter for screening for lipoid disorders: Secondary | ICD-10-CM

## 2022-07-27 DIAGNOSIS — R519 Headache, unspecified: Secondary | ICD-10-CM | POA: Diagnosis not present

## 2022-07-27 DIAGNOSIS — G8929 Other chronic pain: Secondary | ICD-10-CM

## 2022-07-27 MED ORDER — DULOXETINE HCL 60 MG PO CPEP: 60.0000 mg | ORAL_CAPSULE | Freq: Every day | ORAL | 0 refills | Status: DC

## 2022-07-27 MED ORDER — SUMATRIPTAN SUCCINATE 50 MG PO TABS: 50.0000 mg | ORAL_TABLET | ORAL | 0 refills | Status: DC | PRN

## 2022-07-27 NOTE — Assessment & Plan Note (Signed)
Noted with the past few weeks, in the setting of high-level baseline physical activity, anterior radiating, tender at the biceps tendon positive speeds, equivocal Yergason's, equivocal O'Brien's.  Concern for biceps tendinopathy versus labral derangement.  Plan for home-based rehab exercises, as needed Voltaren gel, and she can follow-up on as-needed basis for recalcitrant symptoms.

## 2022-07-27 NOTE — Assessment & Plan Note (Addendum)
Chronic issue, has previously seen gastroenterology with symptoms necessitating EGD and colonoscopy, patient uncertain about specific results but raises possibility of some food allergy.  That being said, does have a long history of GERD, not dosing omeprazole 20 mg.  Her abdominal examination is reassuring with normal active bowel sounds, soft, nontender, and without masses.  I have placed a referral back to gastroenterology to reestablish care as well as advising the patient to restart omeprazole 20 mg daily on empty stomach to assess interim response.

## 2022-07-27 NOTE — Assessment & Plan Note (Signed)
Chronic, stable, tolerating duloxetine 60 mg well without adverse effects reported.

## 2022-07-27 NOTE — Patient Instructions (Addendum)
-   Obtain fasting labs with orders provided (can have water or black coffee but otherwise no food or drink x 8 hours before labs) - Continue duloxetine 60 mg daily - Restart omeprazole 20 mg daily, take on empty stomach - Dose of sumatriptan on an as-needed basis for migraine headache, can repeat dose after 2 hours if symptoms persist, maximum dose 200 mg / 24 hours. - Can use Voltaren gel (diclofenac 1%) as needed for shoulder pain - Referral coordinator will help schedule of visits with neurology and gastroenterology - Return for annual physical in 2 months  Shoulder exercises: [email or web address].pl

## 2022-07-27 NOTE — Assessment & Plan Note (Signed)
Patient raises longstanding history of weekly migraines, 1-2 episodes, ongoing for years, no prior evaluation or formal treatments.  She has noted worsening over the past 2 weeks, no other exposures or changes reported that coincided with symptom change.  She is sitting in a darkened room as she is describing photophobia but otherwise denies any additional neurologic symptoms.  Headache is periorbital bilaterally, cranial nerve testing is benign today.  Plan for trial of sumatriptan as needed, referral to neurologist, and follow-up in 2 months.

## 2022-07-27 NOTE — Progress Notes (Signed)
Primary Care / Sports Medicine Office Visit  Patient Information:  Patient ID: Anna Cook, female DOB: [birth year] Age: 26 y.o. MRN: [id number]   Anna Cook is a pleasant 26 y.o. female presenting with the following:  Chief Complaint  Patient presents with   Headache    This headache has been since this morning, had one yesterday.     Vitals:   07/27/22 1119  BP: 116/76  Pulse: 76  SpO2: 99%   Vitals:   07/27/22 1119  Weight: 152 lb (68.9 kg)  Height: 5\' 3"  (1.6 m)   Body mass index is 26.93 kg/m.  No results found.   Independent interpretation of notes and tests performed by another provider:   None  Procedures performed:   None  Pertinent History, Exam, Impression, and Recommendations:   Problem List Items Addressed This Visit       Musculoskeletal and Integument   Biceps tendinitis, right    Noted with the past few weeks, in the setting of high-level baseline physical activity, anterior radiating, tender at the biceps tendon positive speeds, equivocal Yergason's, equivocal O'Brien's.  Concern for biceps tendinopathy versus labral derangement.  Plan for home-based rehab exercises, as needed Voltaren gel, and she can follow-up on as-needed basis for recalcitrant symptoms.        Other   Anxiety with depression    Chronic, stable, tolerating duloxetine 60 mg well without adverse effects reported.      Relevant Medications   DULoxetine (CYMBALTA) 60 MG capsule   Other Relevant Orders   TSH   Chronic nonintractable headache - Primary    Patient raises longstanding history of weekly migraines, 1-2 episodes, ongoing for years, no prior evaluation or formal treatments.  She has noted worsening over the past 2 weeks, no other exposures or changes reported that coincided with symptom change.  She is sitting in a darkened room as she is describing photophobia but otherwise denies any additional neurologic symptoms.  Headache is periorbital bilaterally,  cranial nerve testing is benign today.  Plan for trial of sumatriptan as needed, referral to neurologist, and follow-up in 2 months.      Relevant Medications   SUMAtriptan (IMITREX) 50 MG tablet   DULoxetine (CYMBALTA) 60 MG capsule   Other Relevant Orders   Ambulatory referral to Neurology   Comprehensive metabolic panel   TSH   CBC with Differential/Platelet   Chronic abdominal pain    Chronic issue, has previously seen gastroenterology with symptoms necessitating EGD and colonoscopy, patient uncertain about specific results but raises possibility of some food allergy.  That being said, does have a long history of GERD, not dosing omeprazole 20 mg.  Her abdominal examination is reassuring with normal active bowel sounds, soft, nontender, and without masses.  I have placed a referral back to gastroenterology to reestablish care as well as advising the patient to restart omeprazole 20 mg daily on empty stomach to assess interim response.      Relevant Medications   DULoxetine (CYMBALTA) 60 MG capsule   Other Relevant Orders   Apo A1 + B + Ratio   Hepatitis C antibody   Lipid panel   HIV Antibody (routine testing w rflx)   VITAMIN D 25 Hydroxy (Vit-D Deficiency, Fractures)   Ambulatory referral to Gastroenterology   Other Visit Diagnoses     Low serum vitamin D       Screening for lipoid disorders       Relevant Orders   Apo A1 +  B + Ratio   Lipid panel   Screening for HIV (human immunodeficiency virus)       Relevant Orders   HIV Antibody (routine testing w rflx)   Need for hepatitis C screening test       Relevant Orders   Hepatitis C antibody        Orders & Medications Meds ordered this encounter  Medications   SUMAtriptan (IMITREX) 50 MG tablet    Sig: Take 1 tablet (50 mg total) by mouth every 2 (two) hours as needed for migraine. May repeat in 2 hours if headache persists or recurs.    Dispense:  30 tablet    Refill:  0   DULoxetine (CYMBALTA) 60 MG capsule     Sig: Take 1 capsule (60 mg total) by mouth daily.    Dispense:  90 capsule    Refill:  0    Pt needs appt for further refill   Orders Placed This Encounter  Procedures   Apo A1 + B + Ratio   Comprehensive metabolic panel   Hepatitis C antibody   Lipid panel   HIV Antibody (routine testing w rflx)   TSH   VITAMIN D 25 Hydroxy (Vit-D Deficiency, Fractures)   CBC with Differential/Platelet   Ambulatory referral to Neurology   Ambulatory referral to Gastroenterology     Return in about 2 months (around 09/26/2022) for Annual Physical.     [name], MD   Primary Care Sports Medicine [facility]

## 2022-07-27 NOTE — Telephone Encounter (Signed)
  Chief Complaint: headache Symptoms: eyes hurt, nausea Frequency: getting more frequent Pertinent Negatives: Patient denies fever, vomiting Disposition: [] ED /[] Urgent Care (no appt availability in office) / [x] Appointment(In office/virtual)/ []  [facility]/ [] Home Care/ [] Refused Recommended Disposition /[] [facility]/ []  Follow-up with PCP Additional Notes: Appt made for this morning. Pt instructed on various things to do at home to help symptoms.   Reason for Disposition  [1] MODERATE headache (e.g., interferes with normal activities) AND [2] present > 24 hours AND [3] unexplained  (Exceptions: analgesics not tried, typical migraine, or headache part of viral illness)  Answer Assessment - Initial Assessment Questions 1. LOCATION: "Where does it hurt?"      Frontal headache with eye pain 2. ONSET: "When did the headache start?" (Minutes, hours or days)      This headache started today, but having having more frequently for past 2 months 3. PATTERN: "Does the pain come and go, or has it been constant since it started?"     Sometimes an hour sometimes the whole day 4. SEVERITY: "How bad is the pain?" and "What does it keep you from doing?"  (e.g., Scale 1-10; mild, moderate, or severe)   - MILD (1-3): doesn't interfere with normal activities    - MODERATE (4-7): interferes with normal activities or awakens from sleep    - SEVERE (8-10): excruciating pain, unable to do any normal activities        8 at times, 5 at times 5. RECURRENT SYMPTOM: "Have you ever had headaches before?" If Yes, ask: "When was the last time?" and "What happened that time?"      2 months, not everyday, getting more frequent, has had 3 in the past 7 days 6. CAUSE: "What do you think is causing the headache?"     unknown 7. MIGRAINE: "Have you been diagnosed with migraine headaches?" If Yes, ask: "Is this headache similar?"      Never diagnosed but has never seen MD for it, also it causes  nausea/abd pain 8. HEAD INJURY: "Has there been any recent injury to the head?"      no 9. OTHER SYMPTOMS: "Do you have any other symptoms?" (fever, stiff neck, eye pain, sore throat, cold symptoms)     Eye pain 10. PREGNANCY: "Is there any chance you are pregnant?" "When was your last menstrual period?"       No  Protocols used: Headache-A-AH

## 2022-08-31 ENCOUNTER — Other Ambulatory Visit: Payer: Self-pay

## 2022-08-31 MED ORDER — ESTARYLLA 0.25-35 MG-MCG PO TABS: 1.0000 | ORAL_TABLET | Freq: Every day | ORAL | 4 refills | Status: DC

## 2022-08-31 NOTE — Telephone Encounter (Signed)
Pt calling; had annual a few months ago; needs refill of bc.  [phone]  Pt states she needs Estarylla refilled.  Called [facility] in [place] and gave verbal order as I would not get it to eRx.  Pt aware refills were going to be sent in.

## 2022-09-27 ENCOUNTER — Ambulatory Visit: Payer: 59 | Admitting: Family Medicine

## 2022-09-30 ENCOUNTER — Encounter: Payer: Self-pay | Admitting: Family Medicine

## 2022-11-14 ENCOUNTER — Other Ambulatory Visit: Payer: Self-pay | Admitting: Family Medicine

## 2022-11-14 DIAGNOSIS — F418 Other specified anxiety disorders: Secondary | ICD-10-CM

## 2022-11-16 NOTE — Telephone Encounter (Signed)
Requested medications are due for refill today.  yes  Requested medications are on the active medications list.  yes  Last refill. 07/27/2022 #90 0 rf  Future visit scheduled.   no  Notes to clinic.  Missing labs    Requested Prescriptions  Pending Prescriptions Disp Refills   DULoxetine (CYMBALTA) 60 MG capsule [Pharmacy Med Name: DULOXETINE DR 60MG CAPSULES] 90 capsule 0    Sig: TAKE 1 CAPSULE(60 MG) BY MOUTH DAILY     Psychiatry: Antidepressants - SNRI - duloxetine Failed - 11/14/2022 11:38 AM      Failed - Cr in normal range and within 360 days    No results found for: "CREATININE", "LABCREAU", "LABCREA", "POCCRE"       Failed - eGFR is 30 or above and within 360 days    No results found for: "GFRAA", "GFRNONAA", "GFR", "EGFR"       Passed - Completed PHQ-2 or PHQ-9 in the last 360 days      Passed - Last BP in normal range    BP Readings from Last 1 Encounters:  07/27/22 116/76         Passed - Valid encounter within last 6 months    Recent Outpatient Visits           3 months ago Chronic nonintractable headache, unspecified headache type   [facility] at [facility], Earley Abide, MD   7 months ago Anxiety with depression   [facility] and [facility] Medicine at [facility], Earley Abide, MD

## 2022-11-17 NOTE — Telephone Encounter (Signed)
Patient will follow up with refill at a later time.

## 2022-11-24 MED ORDER — ESTARYLLA 0.25-35 MG-MCG PO TABS: 1.0000 | ORAL_TABLET | Freq: Every day | ORAL | 1 refills | Status: DC

## 2022-11-24 NOTE — Addendum Note (Signed)
Addended by: Donnetta Hail on: 11/24/2022 11:43 AM   Modules accepted: Orders

## 2022-11-24 NOTE — Telephone Encounter (Signed)
Pt called triage asking for a BC RF. Says pharmacy does not have it. She has moved and would like new Rx called in to [facility] in [place]. Can you help with putting in Rx so it can be sent electronically. I tried but it says incorrect medication.

## 2022-11-25 ENCOUNTER — Telehealth: Payer: Self-pay

## 2022-11-25 ENCOUNTER — Telehealth: Payer: Self-pay | Admitting: Family Medicine

## 2022-11-25 ENCOUNTER — Other Ambulatory Visit: Payer: Self-pay | Admitting: Licensed Practical Nurse

## 2022-11-25 MED ORDER — ESTARYLLA 0.25-35 MG-MCG PO TABS: 1.0000 | ORAL_TABLET | Freq: Every day | ORAL | 1 refills | Status: DC

## 2022-11-25 NOTE — Telephone Encounter (Signed)
Patient called triage, about her birth control stated it wasn't sent. I can see where it was sent and the class was print. So I reorder its and change class to fax. And it came back with a trouble shoot message.  Can you please advise?

## 2022-11-25 NOTE — Telephone Encounter (Signed)
Copied from CRM [id number]. Topic: General - Other >> Nov 25, 2022 10:23 AM Everette C wrote: Reason for CRM: The patient has been directed by their insurance to contact their PCP regarding coding and billing  The patient shares that their office visits have been coded as sports medicine visits and they are being charged roughly $300 out of pocket per visit  The patient would like to speak with a member of administrative staff regarding their bills when possible  Please contact further when available

## 2022-11-26 NOTE — Telephone Encounter (Signed)
Pt response.  KP

## 2022-11-29 NOTE — Telephone Encounter (Signed)
fyi

## 2022-12-02 ENCOUNTER — Ambulatory Visit (INDEPENDENT_AMBULATORY_CARE_PROVIDER_SITE_OTHER): Payer: 59 | Admitting: Family Medicine

## 2022-12-02 ENCOUNTER — Inpatient Hospital Stay (INDEPENDENT_AMBULATORY_CARE_PROVIDER_SITE_OTHER): Payer: 59 | Admitting: Radiology

## 2022-12-02 ENCOUNTER — Encounter: Payer: Self-pay | Admitting: Family Medicine

## 2022-12-02 VITALS — BP 128/78 | HR 88 | Ht 62.0 in | Wt 156.0 lb

## 2022-12-02 DIAGNOSIS — M7522 Bicipital tendinitis, left shoulder: Secondary | ICD-10-CM | POA: Diagnosis not present

## 2022-12-02 DIAGNOSIS — Z23 Encounter for immunization: Secondary | ICD-10-CM | POA: Insufficient documentation

## 2022-12-02 DIAGNOSIS — M7542 Impingement syndrome of left shoulder: Secondary | ICD-10-CM

## 2022-12-02 MED ORDER — TRIAMCINOLONE ACETONIDE 40 MG/ML IJ SUSP
40.0000 mg | Freq: Once | INTRAMUSCULAR | Status: AC
  Administered 2022-12-02: 40 mg via INTRAMUSCULAR

## 2022-12-02 MED ORDER — CELECOXIB 100 MG PO CAPS: 100.0000 mg | ORAL_CAPSULE | Freq: Two times a day (BID) | ORAL | 0 refills | Status: DC | PRN

## 2022-12-02 NOTE — Patient Instructions (Addendum)
You have just been given a cortisone injection to reduce pain and inflammation. After the injection you may notice immediate relief of pain as a result of the Lidocaine. It is important to rest the area of the injection for 24 to 48 hours after the injection. There is a possibility of some temporary increased discomfort and swelling for up to 72 hours until the cortisone begins to work. If you do have pain, simply rest the joint and use ice. If you can tolerate over the counter medications, you can try Tylenol, Aleve, or Advil for added relief per package instructions. - Take Celebrex twice daily with food until shoulder symptoms resolve - Hold from upper body physical activities until Sunday - Starting Sunday, perform rehab exercises x 1 week - After that, can restart normal upper body exercises at a lower weight (slow ramp up) - Contact for questions and follow-up as-needed

## 2022-12-03 NOTE — Assessment & Plan Note (Addendum)
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 Consistent with exam with additional secondary RC impingement ipsilaterally. Giver her treatments to date involving NSAIDs, reviewed additional options and she elected to proceed with ultrasound guided biceps tendon sheath injection. She will also utilize Rx Celebrex PRN with gradual activity ramp up. Can follow-up PRN with low threshold for advanced imaging.

## 2022-12-03 NOTE — Progress Notes (Signed)
     Primary Care / Sports Medicine Office Visit  Patient Information:  Patient ID: Anna Cook, female DOB: 12-Nov-1996 Age: 26 y.o. MRN: [id number]   Christene Pounds is a pleasant 26 y.o. female presenting with the following:  Chief Complaint  Patient presents with   Shoulder Pain    Left, for about 1 month, no injury. Spots kids in gymnastics. Burns    Vitals:   12/02/22 0901  BP: 128/78  Pulse: 88  SpO2: 98%   Vitals:   12/02/22 0901  Weight: 156 lb (70.8 kg)  Height: 5\' 2"  (1.575 m)   Body mass index is 28.53 kg/m.  No results found.   Independent interpretation of notes and tests performed by another provider:   None  Procedures performed:   Procedure:  Injection of left biceps tendon sheath under ultrasound guidance. Ultrasound guidance utilized for out of plane approach, no effusion Samsung HS60 device utilized with permanent recording / reporting. Verbal informed consent obtained and verified. Skin prepped in a sterile fashion. Ethyl chloride for topical local analgesia.  Completed without difficulty and tolerated well. Medication: triamcinolone acetonide 40 mg/mL suspension for injection 1 mL total and 2 mL lidocaine 1% without epinephrine utilized for needle placement anesthetic Advised to contact for fevers/chills, erythema, induration, drainage, or persistent bleeding.   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 Consistent with exam with additional secondary RC impingement ipsilaterally. Giver her treatments to date involving NSAIDs, reviewed additional options and she elected to proceed with ultrasound guided biceps tendon sheath injection. She will also utilize Rx Celebrex PRN with gradual activity ramp up. Can follow-up PRN with low threshold for  advanced imaging.      Relevant Medications   celecoxib (CELEBREX) 100 MG capsule   Other Relevant Orders   LIMITED JOINT SPACE STRUCTURES UP LEFT   Rotator cuff impingement syndrome, left   Relevant Medications   celecoxib (CELEBREX) 100 MG capsule   Other Relevant Orders   [place] LIMITED JOINT SPACE STRUCTURES UP LEFT     Other   Need for immunization against influenza   Relevant Orders   Flu Vaccine QUAD 55mo+IM (Fluarix, Fluzone & Alfiuria Quad PF) (Completed)     Orders & Medications Meds ordered this encounter  Medications   celecoxib (CELEBREX) 100 MG capsule    Sig: Take 1 capsule (100 mg total) by mouth 2 (two) times daily as needed.    Dispense:  30 capsule    Refill:  0   triamcinolone acetonide (KENALOG-40) injection 40 mg   Orders Placed This Encounter  Procedures   5mo LIMITED JOINT SPACE STRUCTURES UP LEFT   Flu Vaccine QUAD 18mo+IM (Fluarix, Fluzone & Alfiuria Quad PF)     Return in about 1 month (around 01/02/2023) for CPE.     01/04/2023, MD, [facility]   Primary Care Sports Medicine Primary Care and Sports Medicine at [facility]

## 2022-12-08 ENCOUNTER — Ambulatory Visit: Payer: 59 | Admitting: Gastroenterology

## 2022-12-22 ENCOUNTER — Other Ambulatory Visit: Payer: Self-pay | Admitting: Family Medicine

## 2022-12-22 DIAGNOSIS — M7522 Bicipital tendinitis, left shoulder: Secondary | ICD-10-CM

## 2022-12-22 DIAGNOSIS — M7542 Impingement syndrome of left shoulder: Secondary | ICD-10-CM

## 2022-12-22 NOTE — Telephone Encounter (Signed)
Requested medication (s) are due for refill today: Yes  Requested medication (s) are on the active medication list: Yes  Last refill:  12/02/22  Future visit scheduled: Yes  Notes to clinic:  Unable to refill per protocol due to failed labs, no updated results.      Requested Prescriptions  Pending Prescriptions Disp Refills   celecoxib (CELEBREX) 100 MG capsule [Pharmacy Med Name: CELECOXIB 100MG CAPSULES] 30 capsule 0    Sig: TAKE 1 CAPSULE(100 MG) BY MOUTH TWICE DAILY AS NEEDED     Analgesics:  COX2 Inhibitors Failed - 12/22/2022  3:36 AM      Failed - Manual Review: Labs are only required if the patient has taken medication for more than 8 weeks.      Failed - HGB in normal range and within 360 days    No results found for: "HGB", "HGBKUC", "HGBPOCKUC", "HGBOTHER", "TOTHGB", "HGBPLASMA"       Failed - Cr in normal range and within 360 days    No results found for: "CREATININE", "LABCREAU", "LABCREA", "POCCRE"       Failed - HCT in normal range and within 360 days    No results found for: "HCT", "HCTKUC", "SRHCT"       Failed - AST in normal range and within 360 days    No results found for: "POCAST", "AST"       Failed - ALT in normal range and within 360 days    No results found for: "ALT", "LABALT", "POCALT"       Failed - eGFR is 30 or above and within 360 days    No results found for: "GFRAA", "GFRNONAA", "GFR", "EGFR"       Passed - Patient is not pregnant      Passed - Valid encounter within last 12 months    Recent Outpatient Visits           2 weeks ago Biceps tendinitis, left   [place] [facility] at [place], [place]   4 months ago Chronic nonintractable headache, unspecified headache type   [facility] Care and [facility] Medicine at [facility], Earley Abide, MD   8 months ago Anxiety with depression   [facility] Primary Care and Sports Medicine at [place], MD        Future Appointments             In 1 week Zigmund Daniel, Earley Abide, MD [facility] Primary Care and Sports Medicine at [facility], [facility]

## 2023-01-04 ENCOUNTER — Ambulatory Visit (INDEPENDENT_AMBULATORY_CARE_PROVIDER_SITE_OTHER): Payer: 59 | Admitting: Family Medicine

## 2023-01-04 ENCOUNTER — Encounter: Payer: Self-pay | Admitting: Family Medicine

## 2023-01-04 VITALS — BP 102/76 | HR 80 | Ht 63.0 in | Wt 153.0 lb

## 2023-01-04 DIAGNOSIS — R109 Unspecified abdominal pain: Secondary | ICD-10-CM | POA: Diagnosis not present

## 2023-01-04 DIAGNOSIS — Z0000 Encounter for general adult medical examination without abnormal findings: Secondary | ICD-10-CM | POA: Diagnosis not present

## 2023-01-04 DIAGNOSIS — G8929 Other chronic pain: Secondary | ICD-10-CM | POA: Diagnosis not present

## 2023-01-04 DIAGNOSIS — R519 Headache, unspecified: Secondary | ICD-10-CM | POA: Diagnosis not present

## 2023-01-04 NOTE — Assessment & Plan Note (Signed)
Acute on chronic, had recently worsened with regular Celebrex dosing.  I have advised her to discontinue this medication and transition to acetaminophen as needed, appropriate labs ordered.

## 2023-01-04 NOTE — Assessment & Plan Note (Signed)
Due to adverse GI effects of Celebrex I have advised her to transition to acetaminophen as needed and utilize Imitrex as prescribed.  She is to contact us for any persistent symptomatology at which point referral to neurology to be considered.

## 2023-01-04 NOTE — Progress Notes (Addendum)
Annual Physical Exam Visit  Patient Information:  Patient ID: Anna Cook, female DOB: 1996-10-30 Age: 27 y.o. MRN: [id number]   Subjective:   CC: Annual Physical Exam  HPI:  Anna Cook is here for their annual physical.  I reviewed the past medical history, family history, social history, surgical history, and allergies today and changes were made as necessary.  Please see the problem list section below for additional details.  Past Medical History: Past Medical History:  Diagnosis Date   Anxiety    Past Surgical History: Past Surgical History:  Procedure Laterality Date   COLONOSCOPY WITH PROPOFOL N/A 12/16/2016   Procedure: COLONOSCOPY WITH PROPOFOL;  Surgeon: Jonathon Bellows, MD;  Location: [facility] ENDOSCOPY;  Service: Endoscopy;  Laterality: N/A;   ESOPHAGOGASTRODUODENOSCOPY (EGD) WITH PROPOFOL N/A 12/16/2016   Procedure: ESOPHAGOGASTRODUODENOSCOPY (EGD) WITH PROPOFOL;  Surgeon: Jonathon Bellows, MD;  Location: [facility] ENDOSCOPY;  Service: Endoscopy;  Laterality: N/A;   Family History: Family History  Problem Relation Age of Onset   Healthy Mother    Hypertension Father    Healthy Father    Hypertension Maternal Grandfather    Stroke Paternal Grandmother    Allergies: Allergies  Allergen Reactions   Penicillins Rash and Hives   Cefdinir Other (See Comments) and Hives   Cephalosporins Rash   Health Maintenance: Health Maintenance  Topic Date Due   HPV VACCINES (1 - 2-dose series) 04/01/2023 (Originally 11/18/2007)   COVID-19 Vaccine (4 - 2023-24 season) 12/18/2026 (Originally 08/20/2022)   PAP-Cervical Cytology Screening  05/18/2025   PAP SMEAR-Modifier  05/18/2025   DTaP/Tdap/Td (2 - Td or Tdap) 07/24/2030   INFLUENZA VACCINE  Completed   Hepatitis C Screening  Completed   HIV Screening  Completed    HM Colonoscopy     This patient has no relevant Health Maintenance data.      Medications: Current Outpatient Medications on File Prior to Visit  Medication  Sig Dispense Refill   celecoxib (CELEBREX) 100 MG capsule TAKE 1 CAPSULE(100 MG) BY MOUTH TWICE DAILY AS NEEDED 30 capsule 0   cetirizine (ZYRTEC) 10 MG tablet daily.     DULoxetine (CYMBALTA) 60 MG capsule TAKE 1 CAPSULE(60 MG) BY MOUTH DAILY 90 capsule 0   ESTARYLLA 0.25-35 MG-MCG tablet Take 1 tablet by mouth daily. 90 tablet 1   mometasone (NASONEX) 50 MCG/ACT nasal spray mometasone 50 mcg/actuation nasal spray  2 SPRAYS EACH NOSTRIL ONCE A DAY. LOOK AT TOES AND POINT TOWARDS EARS.     SUMAtriptan (IMITREX) 50 MG tablet Take 1 tablet (50 mg total) by mouth every 2 (two) hours as needed for migraine. May repeat in 2 hours if headache persists or recurs. 30 tablet 0   No current facility-administered medications on file prior to visit.    Review of Systems: No headache, visual changes, nausea, vomiting, diarrhea, constipation, dizziness, abdominal pain, skin rash, fevers, chills, night sweats, swollen lymph nodes, weight loss, chest pain, body aches, joint swelling, muscle aches, shortness of breath, mood changes, visual or auditory hallucinations reported.  Objective:   Vitals:   01/04/23 1505  BP: 102/76  Pulse: 80  SpO2: 99%   Vitals:   01/04/23 1505  Weight: 153 lb (69.4 kg)  Height: 5\' 3"  (1.6 m)   Body mass index is 27.1 kg/m.  General: Well Developed, well nourished, and in no acute distress.  Neuro: Alert and oriented x3, extra-ocular muscles intact, sensation grossly intact. Cranial nerves II through XII are grossly intact, motor, sensory, and coordinative functions  are intact. HEENT: Normocephalic, atraumatic, pupils equal round reactive to light, neck supple, no masses, no lymphadenopathy, thyroid nonpalpable. Oropharynx, nasopharynx, external ear canals are unremarkable. Skin: Warm and dry, no rashes noted.  Cardiac: Regular rate and rhythm, no murmurs rubs or gallops. No peripheral edema. Pulses symmetric. Respiratory: Clear to auscultation bilaterally. Not using  accessory muscles, speaking in full sentences.  Abdominal: Soft, nontender, nondistended, positive bowel sounds, no masses, no organomegaly. Musculoskeletal: Shoulder, elbow, wrist, hip, knee, ankle stable, and with full range of motion.  Female chaperone initials: AH present throughout the physical examination.  Impression and Recommendations:   The patient was counselled, risk factors were discussed, and anticipatory guidance given.  Problem List Items Addressed This Visit       Other   Chronic nonintractable headache    Due to adverse GI effects of Celebrex I have advised her to transition to acetaminophen as needed and utilize Imitrex as prescribed.  She is to contact us for any persistent symptomatology at which point referral to neurology to be considered.      Chronic abdominal pain    Acute on chronic, had recently worsened with regular Celebrex dosing.  I have advised her to discontinue this medication and transition to acetaminophen as needed, appropriate labs ordered.      Annual physical exam - Primary    Annual examination completed, risk stratification labs ordered, anticipatory guidance provided.  We will follow labs once resulted.        Orders & Medications Medications: No orders of the defined types were placed in this encounter.  No orders of the defined types were placed in this encounter.    Return in about 1 year (around 01/05/2024) for CPE.    Montel Culver, MD, [facility]   Primary Care Sports Medicine Primary Care and Sports Medicine at [facility]

## 2023-01-04 NOTE — Patient Instructions (Signed)
-  Obtain fasting labs with orders provided (can have water or black coffee but otherwise no food or drink x 8 hours before labs) - Review information provided - Attend eye doctor annually, dentist every 6 months, work towards or maintain 30 minutes of moderate intensity physical activity at least 5 days per week, and consume a balanced diet - Return in 1 year for physical - Contact us for any questions between now and then  Additionally: - Stop Celebrex - For migraine use Tylenol, if still symptomatic, use Imitrex

## 2023-01-04 NOTE — Assessment & Plan Note (Signed)
Annual examination completed, risk stratification labs ordered, anticipatory guidance provided.  We will follow labs once resulted. 

## 2023-01-10 ENCOUNTER — Telehealth: Payer: Self-pay | Admitting: Family Medicine

## 2023-01-10 NOTE — Telephone Encounter (Signed)
Copied from [place] [id number]. 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 Patient has lab orders.

## 2023-01-10 NOTE — Telephone Encounter (Signed)
noted 

## 2023-01-11 ENCOUNTER — Other Ambulatory Visit: Payer: Self-pay | Admitting: Family Medicine

## 2023-01-11 DIAGNOSIS — E559 Vitamin D deficiency, unspecified: Secondary | ICD-10-CM

## 2023-01-11 LAB — COMPREHENSIVE METABOLIC PANEL
ALT: 10 IU/L (ref 0–32)
AST: 14 IU/L (ref 0–40)
Albumin/Globulin Ratio: 1.7 (ref 1.2–2.2)
Albumin: 4.2 g/dL (ref 4.0–5.0)
Alkaline Phosphatase: 53 IU/L (ref 44–121)
BUN/Creatinine Ratio: 13 (ref 9–23)
BUN: 14 mg/dL (ref 6–20)
Bilirubin Total: 0.7 mg/dL (ref 0.0–1.2)
CO2: 21 mmol/L (ref 20–29)
Calcium: 9.3 mg/dL (ref 8.7–10.2)
Chloride: 100 mmol/L (ref 96–106)
Creatinine, Ser: 1.07 mg/dL — ABNORMAL HIGH (ref 0.57–1.00)
Globulin, Total: 2.5 g/dL (ref 1.5–4.5)
Glucose: 88 mg/dL (ref 70–99)
Potassium: 4.3 mmol/L (ref 3.5–5.2)
Sodium: 138 mmol/L (ref 134–144)
Total Protein: 6.7 g/dL (ref 6.0–8.5)
eGFR: 73 mL/min/{1.73_m2} (ref >59)

## 2023-01-11 LAB — CBC WITH DIFFERENTIAL/PLATELET
Basophils Absolute: 0 10*3/uL (ref 0.0–0.2)
Basos: 1 %
EOS (ABSOLUTE): 0.1 10*3/uL (ref 0.0–0.4)
Eos: 1 %
Hematocrit: 43.7 % (ref 34.0–46.6)
Hemoglobin: 14.6 g/dL (ref 11.1–15.9)
Immature Grans (Abs): 0 10*3/uL (ref 0.0–0.1)
Immature Granulocytes: 1 %
Lymphocytes Absolute: 2.8 10*3/uL (ref 0.7–3.1)
Lymphs: 39 %
MCH: 29.9 pg (ref 26.6–33.0)
MCHC: 33.4 g/dL (ref 31.5–35.7)
MCV: 89 fL (ref 79–97)
Monocytes Absolute: 0.4 10*3/uL (ref 0.1–0.9)
Monocytes: 6 %
Neutrophils Absolute: 3.8 10*3/uL (ref 1.4–7.0)
Neutrophils: 52 %
Platelets: 288 10*3/uL (ref 150–450)
RBC: 4.89 x10E6/uL (ref 3.77–5.28)
RDW: 11.8 % (ref 11.7–15.4)
WBC: 7.3 10*3/uL (ref 3.4–10.8)

## 2023-01-11 LAB — LIPID PANEL
Chol/HDL Ratio: 2.5 ratio (ref 0.0–4.4)
Cholesterol, Total: 163 mg/dL (ref 100–199)
HDL: 65 mg/dL (ref >39)
LDL Chol Calc (NIH): 84 mg/dL (ref 0–99)
Triglycerides: 75 mg/dL (ref 0–149)
VLDL Cholesterol Cal: 14 mg/dL (ref 5–40)

## 2023-01-11 LAB — VITAMIN D 25 HYDROXY (VIT D DEFICIENCY, FRACTURES): Vit D, 25-Hydroxy: 24.7 ng/mL — ABNORMAL LOW (ref 30.0–100.0)

## 2023-01-11 LAB — APO A1 + B + RATIO
Apolipo. B/A-1 Ratio: 0.5 ratio (ref 0.0–0.6)
Apolipoprotein A-1: 180 mg/dL (ref 116–209)
Apolipoprotein B: 90 mg/dL — ABNORMAL HIGH (ref <90)

## 2023-01-11 LAB — TSH: TSH: 2.46 u[IU]/mL (ref 0.450–4.500)

## 2023-01-11 LAB — HEPATITIS C ANTIBODY: Hep C Virus Ab: NONREACTIVE

## 2023-01-11 LAB — HIV ANTIBODY (ROUTINE TESTING W REFLEX): HIV Screen 4th Generation wRfx: NONREACTIVE

## 2023-01-11 MED ORDER — VITAMIN D (ERGOCALCIFEROL) 1.25 MG (50000 UNIT) PO CAPS: 50000.0000 [IU] | ORAL_CAPSULE | ORAL | 0 refills | Status: DC

## 2023-02-21 ENCOUNTER — Other Ambulatory Visit: Payer: Self-pay | Admitting: Family Medicine

## 2023-02-21 NOTE — Telephone Encounter (Signed)
Medication Refill - Medication: Estarylla 0.25-0.035 mg  Has the patient contacted their pharmacy? No.  She has new insurance and [facility] (Agent: If no, request that the patient contact the pharmacy for the refill. If patient does not wish to contact the pharmacy document the reason why and proceed with request.) (Agent: If yes, when and what did the pharmacy advise?)  Preferred Pharmacy (with phone number or street name): [facility] [address] Has the patient been seen for an appointment in the last year OR does the patient have an upcoming appointment? Yes.    Agent: Please be advised that RX refills may take up to 3 business days. We ask that you follow-up with your pharmacy.

## 2023-02-22 NOTE — Telephone Encounter (Signed)
Requested medication (s) are due for refill today  Refill available   Requested medication (s) are on the active medication list: yes    Last refill: 11/25/22  #90  1 refill  Future visit scheduled Yes 1/2/025  Notes to clinic:Historical provider, please review. Thank you.  Requested Prescriptions  Pending Prescriptions Disp Refills   ESTARYLLA 0.25-35 MG-MCG tablet 90 tablet 1    Sig: Take 1 tablet by mouth daily.     OB/GYN:  Contraceptives Passed - 02/21/2023  5:15 PM      Passed - Last BP in normal range    BP Readings from Last 1 Encounters:  01/04/23 102/76         Passed - Valid encounter within last 12 months    Recent Outpatient Visits           1 month ago Annual physical exam   [place] [facility] [place], [place]   2 months ago Biceps tendinitis, left   [facility] [place] [facility] [place], Earley Abide, MD   7 months ago Chronic nonintractable headache, unspecified headache type   [place] at [facility], Earley Abide, MD   10 months ago Anxiety with depression   [facility] [place] at [facility], Earley Abide, MD       Future Appointments             In 10 months Zigmund Daniel, Earley Abide, MD [place] at [facility], [place] - Patient is not a smoker

## 2023-03-12 ENCOUNTER — Other Ambulatory Visit: Payer: Self-pay | Admitting: Family Medicine

## 2023-03-12 DIAGNOSIS — E559 Vitamin D deficiency, unspecified: Secondary | ICD-10-CM

## 2023-03-14 NOTE — Telephone Encounter (Signed)
Requested medications are due for refill today.  Provider to determine  Requested medications are on the active medications list.  yes  Last refill. 01/11/2023 #8 0 rf  Future visit scheduled.   yes  Notes to clinic.  Refill not delegated.    Requested Prescriptions  Pending Prescriptions Disp Refills   Vitamin D, Ergocalciferol, (DRISDOL) 1.25 MG (50000 UNIT) CAPS capsule [Pharmacy Med Name: VITAMIN D2 50,000IU (ERGO) CAP RX] 8 capsule 0    Sig: TAKE ONE CAPSULE BY MOUTH EVERY 7 DAYS FOR A TOTAL OF 8 DOSES     Endocrinology:  Vitamins - Vitamin D Supplementation 2 Failed - 03/12/2023  3:34 AM      Failed - Manual Review: Route requests for 50,000 IU strength to the provider      Failed - Vitamin D in normal range and within 360 days    Vit D, 25-Hydroxy  Date Value Ref Range Status  01/10/2023 24.7 (L) 30.0 - 100.0 ng/mL Final    Comment:    Vitamin D deficiency has been defined by the Institute of Medicine and an Endocrine Society practice guideline as a level of serum 25-OH vitamin D less than 20 ng/mL (1,2). The Endocrine Society went on to further define vitamin D insufficiency as a level between 21 and 29 ng/mL (2). 1. IOM (Institute of Medicine). 2010. Dietary reference    intakes for calcium and D. [place]: The    [employer]. 2. Holick MF, Binkley , Bischoff-Ferrari HA, et al.    Evaluation, treatment, and prevention of vitamin D    deficiency: an Endocrine Society clinical practice    guideline. JCEM. 2011 Jul; 96(7):1911-30.          Passed - Ca in normal range and within 360 days    Calcium  Date Value Ref Range Status  01/10/2023 9.3 8.7 - 10.2 mg/dL Final         Passed - Valid encounter within last 12 months    Recent Outpatient Visits           2 months ago Annual physical exam   [place] at [place], Earley Abide, MD   3 months ago Biceps tendinitis, left   [facility] [place] [facility] [place], Earley Abide, MD   7 months ago Chronic nonintractable headache, unspecified headache type   [place] at [facility], Earley Abide, MD   11 months ago Anxiety with depression   [place] at [facility], Earley Abide, MD       Future Appointments             In 10 months Zigmund Daniel, Earley Abide, MD [place] at [facility], [facility]

## 2023-03-17 ENCOUNTER — Telehealth (INDEPENDENT_AMBULATORY_CARE_PROVIDER_SITE_OTHER): Payer: BC Managed Care – PPO | Admitting: Family Medicine

## 2023-03-17 ENCOUNTER — Encounter: Payer: Self-pay | Admitting: Family Medicine

## 2023-03-17 VITALS — Ht 63.0 in

## 2023-03-17 DIAGNOSIS — F418 Other specified anxiety disorders: Secondary | ICD-10-CM

## 2023-03-17 DIAGNOSIS — J0110 Acute frontal sinusitis, unspecified: Secondary | ICD-10-CM | POA: Diagnosis not present

## 2023-03-17 MED ORDER — AZITHROMYCIN 250 MG PO TABS: ORAL_TABLET | ORAL | 0 refills | Status: AC

## 2023-03-17 MED ORDER — PROMETHAZINE-DM 6.25-15 MG/5ML PO SYRP: 5.0000 mL | ORAL_SOLUTION | Freq: Four times a day (QID) | ORAL | 0 refills | Status: DC | PRN

## 2023-03-17 MED ORDER — FLUCONAZOLE 150 MG PO TABS: 150.0000 mg | ORAL_TABLET | Freq: Once | ORAL | 0 refills | Status: AC

## 2023-03-17 MED ORDER — DULOXETINE HCL 30 MG PO CPEP: 30.0000 mg | ORAL_CAPSULE | Freq: Every day | ORAL | 0 refills | Status: DC

## 2023-03-17 NOTE — Progress Notes (Signed)
Primary Care / Sports Medicine Virtual Visit  Patient Information:  Patient ID: Anna Cook, female DOB: 1996-11-14 Age: 27 y.o. MRN: [id number]   Anna Cook is a pleasant 27 y.o. female presenting with the following:  Chief Complaint  Patient presents with   Nasal Congestion    2 weeks cough, denies SOB, wheezing.     Review of Systems: No fevers, chills, night sweats, weight loss, chest pain, or shortness of breath.   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   DULoxetine (CYMBALTA) 30 MG capsule Take 1 capsule (30 mg total) by mouth daily.   ESTARYLLA 0.25-35 MG-MCG tablet Take 1 tablet by mouth daily.   fluconazole (DIFLUCAN) 150 MG tablet Take 1 tablet (150 mg total) by mouth once for 1 dose.   mometasone (NASONEX) 50 MCG/ACT nasal spray mometasone 50 mcg/actuation nasal spray  2 SPRAYS EACH NOSTRIL ONCE A DAY. LOOK AT TOES AND POINT TOWARDS EARS.   omeprazole (PRILOSEC) 20 MG capsule omeprazole 20 mg capsule,delayed release   promethazine-dextromethorphan (PROMETHAZINE-DM) 6.25-15 MG/5ML syrup Take 5 mLs by mouth 4 (four) times daily as needed  for cough.   SUMAtriptan (IMITREX) 50 MG tablet Take 1 tablet (50 mg total) by mouth every 2 (two) hours as needed for migraine. May repeat in 2 hours if headache persists or recurs.   Vitamin D, Ergocalciferol, (DRISDOL) 1.25 MG (50000 UNIT) CAPS capsule Take 1 capsule (50,000 Units total) by mouth every 7 (seven) days. Take for 8 total doses(weeks)   [DISCONTINUED] DULoxetine (CYMBALTA) 60 MG capsule TAKE 1 CAPSULE(60 MG) BY MOUTH DAILY   [DISCONTINUED] FLUoxetine (PROZAC) 40 MG capsule fluoxetine 40 mg capsule (Patient not taking: Reported on 03/17/2023)   No facility-administered encounter medications on file as of 03/17/2023.   Past Surgical History:  Procedure Laterality Date   COLONOSCOPY WITH PROPOFOL N/A 12/16/2016   Procedure: COLONOSCOPY WITH PROPOFOL;  Surgeon: Jonathon Bellows, MD;  Location: [facility] ENDOSCOPY;  Service: Endoscopy;  Laterality: N/A;   ESOPHAGOGASTRODUODENOSCOPY (EGD) WITH PROPOFOL N/A 12/16/2016   Procedure: ESOPHAGOGASTRODUODENOSCOPY (EGD) WITH PROPOFOL;  Surgeon: Jonathon Bellows, MD;  Location: [facility] ENDOSCOPY;  Service: Endoscopy;  Laterality: N/A;    Virtual Visit via MyChart Video:   I connected with Anna Cook on 03/17/23 via MyChart Video and verified that I am speaking with the correct person using appropriate identifiers.   The limitations, risks, security and privacy concerns of performing an evaluation and management service by MyChart Video, including the higher likelihood of inaccurate diagnoses and treatments, and the availability of in person appointments were reviewed. The possible need of an additional face-to-face encounter for complete and high quality delivery of care was discussed. The  patient was also made aware that there may be a patient responsible charge related to this service. The patient expressed understanding and wishes to proceed.  Provider location is in medical facility. Patient location is at their work, different from provider  location. People involved in care of the patient during this telehealth encounter were myself, my nurse/medical assistant, and my front office/scheduling team member.  Objective findings:   General: Speaking full sentences, no audible heavy breathing. Sounds alert and appropriately interactive. Well-appearing. Face symmetric. Extraocular movements intact. Pupils equal and round. No nasal flaring or accessory muscle use visualized.  Independent interpretation of notes and tests performed by another provider:   None  Pertinent History, Exam, Impression, and Recommendations:   Acute non-recurrent frontal sinusitis 2-week history of sinus symptoms, reported symptoms most consistent with sinusitis.  Given the timeframe of symptoms, plan as follows:  - Azithromycin course - Promethazine-DM as needed  Anxiety with depression Has been dosing duloxetine 60 mg without significant benefit to mood, also noticing adverse GI effects/nausea.  Plan as follows:  - Titrate down to 30 mg x 2 weeks then discontinue - Coordinate video MyChart follow-up in 2 weeks to discuss alternate regimens  Orders & Medications Meds ordered this encounter  Medications   azithromycin (ZITHROMAX) 250 MG tablet    Sig: Take 2 tablets on day 1, then 1 tablet daily on days 2 through 5    Dispense:  6 tablet    Refill:  0   fluconazole (DIFLUCAN) 150 MG tablet    Sig: Take 1 tablet (150 mg total) by mouth once for 1 dose.    Dispense:  1 tablet    Refill:  0   DULoxetine (CYMBALTA) 30 MG capsule    Sig: Take 1 capsule (30 mg total) by mouth daily.    Dispense:  14 capsule    Refill:  0   promethazine-dextromethorphan (PROMETHAZINE-DM) 6.25-15 MG/5ML syrup    Sig: Take 5 mLs by mouth 4 (four) times daily as needed for cough.    Dispense:  118 mL    Refill:  0   No orders of the defined types were placed in this encounter.    I discussed the above assessment and treatment plan with the patient. The patient was  provided an opportunity to ask questions and all were answered. The patient agreed with the plan and demonstrated an understanding of the instructions.   The patient was advised to call back or seek an in-person evaluation if the symptoms worsen or if the condition fails to improve as anticipated.   I provided a total time of 30 minutes including both face-to-face and non-face-to-face time on 03/17/2023 inclusive of time utilized for medical chart review, information gathering, care coordination with staff, and documentation completion.    Montel Culver, MD, [facility]   Primary Care Sports Medicine Primary Care and Sports Medicine at [facility]

## 2023-03-17 NOTE — Assessment & Plan Note (Signed)
Has been dosing duloxetine 60 mg without significant benefit to mood, also noticing adverse GI effects/nausea.  Plan as follows:  - Titrate down to 30 mg x 2 weeks then discontinue - Coordinate video MyChart follow-up in 2 weeks to discuss alternate regimens

## 2023-03-17 NOTE — Assessment & Plan Note (Signed)
2-week history of sinus symptoms, reported symptoms most consistent with sinusitis.  Given the timeframe of symptoms, plan as follows:  - Azithromycin course - Promethazine-DM as needed

## 2023-03-17 NOTE — Patient Instructions (Addendum)
-   Take antibiotic for full course - Can use Rx cough syrup as needed - Reduce duloxetine to 30 mg daily (new Rx sent) - Take x 2 weeks then stop medication -Video virtual follow-up in 2 weeks

## 2023-03-24 ENCOUNTER — Ambulatory Visit: Payer: BC Managed Care – PPO | Admitting: Family Medicine

## 2023-03-31 ENCOUNTER — Telehealth: Payer: Self-pay | Admitting: Family Medicine

## 2023-03-31 ENCOUNTER — Telehealth: Payer: BC Managed Care – PPO | Admitting: Family Medicine

## 2023-03-31 ENCOUNTER — Encounter: Payer: Self-pay | Admitting: Family Medicine

## 2023-03-31 DIAGNOSIS — M7522 Bicipital tendinitis, left shoulder: Secondary | ICD-10-CM

## 2023-03-31 DIAGNOSIS — F418 Other specified anxiety disorders: Secondary | ICD-10-CM | POA: Diagnosis not present

## 2023-03-31 MED ORDER — AUVELITY 45-105 MG PO TBCR: 1.0000 | EXTENDED_RELEASE_TABLET | Freq: Two times a day (BID) | ORAL | 2 refills | Status: DC

## 2023-03-31 NOTE — Progress Notes (Signed)
Primary Care / Sports Medicine Virtual Visit  Patient Information:  Patient ID: Anna Cook, female DOB: 12/15/1996 Age: 27 y.o. MRN: [id number]   Anna Cook is a pleasant 27 y.o. female presenting with the following:  Chief Complaint  Patient presents with   Anxiety with depression    Review of Systems: No fevers, chills, night sweats, weight loss, chest pain, or shortness of breath.   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   Dextromethorphan-buPROPion ER (AUVELITY) 45-105 MG TBCR Take 1 tablet by mouth in the morning and at bedtime.   ESTARYLLA 0.25-35 MG-MCG tablet Take 1 tablet by mouth daily.   mometasone (NASONEX) 50 MCG/ACT nasal spray mometasone 50 mcg/actuation nasal spray  2 SPRAYS EACH NOSTRIL ONCE A DAY. LOOK AT TOES AND POINT TOWARDS EARS.   omeprazole (PRILOSEC) 20 MG capsule omeprazole 20 mg capsule,delayed release   SUMAtriptan (IMITREX) 50 MG tablet Take 1 tablet (50 mg total) by mouth every 2 (two) hours as needed for migraine. May repeat in 2 hours if headache persists or recurs.   Vitamin D, Ergocalciferol, (DRISDOL) 1.25 MG (50000 UNIT) CAPS capsule Take 1 capsule (50,000 Units total) by mouth every 7 (seven) days. Take for 8 total  doses(weeks)   [DISCONTINUED] DULoxetine (CYMBALTA) 30 MG capsule Take 1 capsule (30 mg total) by mouth daily.   [DISCONTINUED] promethazine-dextromethorphan (PROMETHAZINE-DM) 6.25-15 MG/5ML syrup Take 5 mLs by mouth 4 (four) times daily as needed for cough.   No facility-administered encounter medications on file as of 03/31/2023.   Past Surgical History:  Procedure Laterality Date   COLONOSCOPY WITH PROPOFOL N/A 12/16/2016   Procedure: COLONOSCOPY WITH PROPOFOL;  Surgeon: Wyline Mood, MD;  Location: [facility]COPY;  Service: Endoscopy;  Laterality: N/A;   ESOPHAGOGASTRODUODENOSCOPY (EGD) WITH PROPOFOL N/A 12/16/2016   Procedure: ESOPHAGOGASTRODUODENOSCOPY (EGD) WITH PROPOFOL;  Surgeon: Wyline Mood, MD;  Location: [facility];  Service: Endoscopy;  Laterality: N/A;    Virtual Visit via MyChart Video:   I connected with Wandalee Withrow on 03/31/23 via MyChart Video and verified that I am speaking with the correct person using appropriate identifiers.   The limitations, risks, security and privacy concerns of performing an evaluation and management service by MyChart Video, including the higher likelihood of inaccurate diagnoses and treatments, and the availability of in person appointments were reviewed. The possible need of an additional face-to-face encounter for complete and high quality delivery of care was discussed. The patient was also made aware that there may be a patient responsible charge related to this service. The patient expressed understanding and wishes to proceed.  Provider location is in medical facility. Patient location is at their work, different from provider location. People involved in care of the patient during this telehealth encounter were myself, my nurse/medical assistant, and my front office/scheduling  team member.  Objective findings:   General: Speaking full sentences, no audible heavy breathing. Sounds alert and appropriately interactive. Well-appearing.  Face symmetric. Extraocular movements intact. Pupils equal and round. No nasal flaring or accessory muscle use visualized.  Independent interpretation of notes and tests performed by another provider:   None  Pertinent History, Exam, Impression, and Recommendations:   Anxiety with depression Has continued to note significant symptoms with depression and anxiety, loss of interest, tearful at times despite consistent duloxetine dosing.  - Stop duloxetine - Start Auvelity, appropriate titration reviewed at length with patient - MyChart video visit in 1 month - If still symptomatic, can consider augmentation with Vraylar  Orders & Medications Meds ordered this encounter  Medications   Dextromethorphan-buPROPion ER (AUVELITY) 45-105 MG TBCR    Sig: Take 1 tablet by mouth in the morning and at bedtime.    Dispense:  60 tablet    Refill:  2   No orders of the defined types were placed in this encounter.    I discussed the above assessment and treatment plan with the patient. The patient was provided an opportunity to ask questions and all were answered. The patient agreed with the plan and demonstrated an understanding of the instructions.   The patient was advised to call back or seek an in-person evaluation if the symptoms worsen or if the condition fails to improve as anticipated.   I provided a total time of 30 minutes including both face-to-face and non-face-to-face time on 03/31/2023 inclusive of time utilized for medical chart review, information gathering, care coordination with staff, and documentation completion.    [name], MD, [facility]   Primary Care Sports Medicine Primary Care and Sports Medicine at [facility]

## 2023-03-31 NOTE — Telephone Encounter (Signed)
Copied from CRM [id number]. 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 She stated she would join her appointment with Dr.Matthews via MyChart.  Please advise.

## 2023-03-31 NOTE — Patient Instructions (Signed)
-   Start Auvelity, take 1 tablet daily in the morning for 3 days - After 3 days increase to 1 tablet twice daily - Online pharmacy will contact you to coordinate this - Increase your water intake, limit workout supplements, and limit NSAID painkillers (Celebrex, ibuprofen, naproxen) - Consider daily over-the-counter vitamin D - MyChart video visit in 1 month - Contact us for any question/concerns between now and then

## 2023-03-31 NOTE — Telephone Encounter (Signed)
Copied from CRM [id number]. Topic: General - Other >> Mar 31, 2023 12:13 PM Dominique A wrote: Reason for CRM: Pt states that she was seen in the office today by her PCP and her medication: Dextromethorphan-buPROPion ER (AUVELITY) 45-105 MG TBCR was sent to [facility] - [place], [place] - [address]  Phone: [phone] Fax: [phone]  Pt has received a message from [facility] stating due to pt insurance they cannot fill her medication and the best way for her to get her medication is for it to be transferred to a Pharmacy. Please call pt back.

## 2023-03-31 NOTE — Assessment & Plan Note (Signed)
Has continued to note significant symptoms with depression and anxiety, loss of interest, tearful at times despite consistent duloxetine dosing.  - Stop duloxetine - Start Auvelity, appropriate titration reviewed at length with patient - MyChart video visit in 1 month - If still symptomatic, can consider augmentation with [employer] geologist

## 2023-04-01 ENCOUNTER — Other Ambulatory Visit: Payer: Self-pay | Admitting: Family Medicine

## 2023-04-01 ENCOUNTER — Telehealth: Payer: Self-pay | Admitting: Family Medicine

## 2023-04-01 DIAGNOSIS — F418 Other specified anxiety disorders: Secondary | ICD-10-CM

## 2023-04-01 MED ORDER — AUVELITY 45-105 MG PO TBCR: 1.0000 | EXTENDED_RELEASE_TABLET | Freq: Two times a day (BID) | ORAL | 0 refills | Status: DC

## 2023-04-01 NOTE — Telephone Encounter (Signed)
Copied from CRM [id number]. Topic: General - Inquiry >> Apr 01, 2023 11:43 AM Marlow Baars wrote: Reason for CRM: The patient called in stating she would like a call back in regards to a call she received that told her to call the office. She is in school but can talk anytime after 3. Please assist patient further.

## 2023-04-01 NOTE — Telephone Encounter (Signed)
Wrong info on pt

## 2023-04-01 NOTE — Telephone Encounter (Signed)
Please advise 

## 2023-04-01 NOTE — Telephone Encounter (Signed)
Requested medication (s) are due for refill today - no  Requested medication (s) are on the active medication list -yes  Future visit scheduled -yes  Last refill: 04/01/23  Notes to clinic: Pharmacy request:Alternative- Rx not covered  Requested Prescriptions  Pending Prescriptions Disp Refills   AUVELITY 45-105 MG TBCR [Pharmacy Med Name: AUVELITY ER 45-105 MG TABLET] 180 tablet 0    Sig: TAKE 1 TABLET BY MOUTH IN THE MORNING AND IN THE EVENING     Off-Protocol Failed - 04/01/2023 11:52 AM      Failed - Medication not assigned to a protocol, review manually.      Passed - Valid encounter within last 12 months    Recent Outpatient Visits           Yesterday Anxiety with depression   [place] [facility], Ocie Bob, MD   2 weeks ago Acute non-recurrent frontal sinusitis   [place] [facility] at [facility], Ocie Bob, MD   2 months ago Annual physical exam   [facility] at [facility], Ocie Bob, MD   4 months ago Biceps tendinitis, left   [facility] at [facility], Ocie Bob, MD   8 months ago Chronic nonintractable headache, unspecified headache type   [facility] Care & [facility] Medicine at [facility], Ocie Bob, MD       Future Appointments             In 9 months Ashley Royalty, Ocie Bob, MD [facility] Primary Care & Sports Medicine at [facility], [facility]               Requested Prescriptions  Pending Prescriptions Disp Refills   AUVELITY 45-105 MG TBCR [Pharmacy Med Name: AUVELITY ER 45-105 MG TABLET] 180 tablet 0    Sig: TAKE 1 TABLET BY MOUTH IN THE MORNING AND IN THE EVENING     Off-Protocol Failed - 04/01/2023 11:52 AM      Failed - Medication not assigned to a protocol, review manually.      Passed - Valid encounter within last 12 months    Recent Outpatient Visits            Yesterday Anxiety with depression   [place] [facility] at [facility], Ocie Bob, MD   2 weeks ago Acute non-recurrent frontal sinusitis   [place] [facility] at [facility], Ocie Bob, MD   2 months ago Annual physical exam   [facility] & [facility] Medicine at [facility], Ocie Bob, MD   4 months ago Biceps tendinitis, left   [facility] Care & Sports Medicine at [facility], Ocie Bob, MD   8 months ago Chronic nonintractable headache, unspecified headache type   [facility] & Sports Medicine at [facility], Ocie Bob, MD       Future Appointments             In 9 months Ashley Royalty, Ocie Bob, MD [facility] Primary Care & Sports Medicine at [facility], [facility]

## 2023-04-01 NOTE — Telephone Encounter (Signed)
PC to pt, talked about PA.

## 2023-04-15 ENCOUNTER — Encounter: Payer: Self-pay | Admitting: Family Medicine

## 2023-04-15 NOTE — Telephone Encounter (Signed)
Please advsie

## 2023-04-21 ENCOUNTER — Telehealth: Payer: BC Managed Care – PPO | Admitting: Family Medicine

## 2023-04-21 ENCOUNTER — Ambulatory Visit: Payer: BC Managed Care – PPO | Admitting: Family Medicine

## 2023-04-21 ENCOUNTER — Encounter: Payer: Self-pay | Admitting: Family Medicine

## 2023-04-21 VITALS — BP 104/78 | HR 100 | Ht 63.0 in | Wt 151.0 lb

## 2023-04-21 DIAGNOSIS — M5417 Radiculopathy, lumbosacral region: Secondary | ICD-10-CM | POA: Diagnosis not present

## 2023-04-21 DIAGNOSIS — F418 Other specified anxiety disorders: Secondary | ICD-10-CM

## 2023-04-21 MED ORDER — AUVELITY 45-105 MG PO TBCR: EXTENDED_RELEASE_TABLET | ORAL | 2 refills | Status: DC

## 2023-04-21 NOTE — Assessment & Plan Note (Signed)
Patient is doing very well on current Auvelity twice daily dosing, has titrated accordingly.  No adverse effects reported.  Plan to continue medication and she can contact us for any issues between now and her annual physical.  Refills can be prescribed when needed.

## 2023-04-21 NOTE — Assessment & Plan Note (Signed)
Acute on chronic issue, incidentally noted during the initiation of Auvelity however this has been a longstanding issue noted with weight lifting, at times during intercourse, involves paresthesias throughout the leg to the feet, no bowel/bladder dysfunction reported, no urinary change/discharge reported.  Examination with positive straight leg raise bilaterally, tightness around the low back/hip with FABER testing, negative FADIR, sensorimotor intact, tenderness about the SI joints.  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  Plan as follows: - Dose meloxicam daily as needed - Start home-based rehab program - Patient to contact us for any persistent/worsening symptoms, imaging to be coordinated at that point - Follow-up as needed on this issue

## 2023-04-21 NOTE — Patient Instructions (Addendum)
-   Continue Auvelity twice daily - Dose meloxicam daily as needed (take with food) - Start home-based rehab program, see exercise below - Contact us for any persistent/worsening symptoms, imaging to be coordinated at that point - Contact us for any question/concerns regarding Auvelity and/or low back/groin symptoms - Return for annual physical as scheduled

## 2023-04-21 NOTE — Progress Notes (Signed)
     Primary Care / Sports Medicine Office Visit  Patient Information:  Patient ID: Anna Cook, female DOB: Jul 27, 1996 Age: 27 y.o. MRN: [id number]   Anna Cook is a pleasant 27 y.o. female presenting with the following:  Chief Complaint  Patient presents with   Anxiety with depression    Vitals:   04/21/23 1044  BP: 104/78  Pulse: 100  SpO2: 98%   Vitals:   04/21/23 1044  Weight: 151 lb (68.5 kg)  Height: 5\' 3"  (1.6 m)   Body mass index is 26.75 kg/m.  No results found.   Independent interpretation of notes and tests performed by another provider:   None  Procedures performed:   None  Pertinent History, Exam, Impression, and Recommendations:   Anna Cook was seen today for anxiety with depression.  Lumbosacral radiculopathy Assessment & Plan: Acute on chronic issue, incidentally noted during the initiation of Auvelity however this has been a longstanding issue noted with weight lifting, at times during intercourse, involves paresthesias throughout the leg to the feet, no bowel/bladder dysfunction reported, no urinary change/discharge reported.  Examination with positive straight leg raise bilaterally, tightness around the low back/hip with FABER testing, negative FADIR, sensorimotor intact, tenderness about the SI joints.  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  Plan as follows: - Dose meloxicam daily as needed - Start home-based rehab program - Patient to contact us for any persistent/worsening symptoms, imaging to be coordinated at that point - Follow-up as needed on this issue   Anxiety with depression Overview: Prior treatments: - Prozac - Cymbalta - Auvelity (current)  Assessment & Plan: Patient is doing very well on current Auvelity twice daily dosing, has titrated accordingly.  No adverse effects reported.  Plan to continue medication and  she can contact us for any issues between now and her annual physical.  Refills can be prescribed when needed.  Orders: -     Auvelity; TAKE 1 TABLET BY MOUTH IN THE MORNING AND IN THE EVENING  Dispense: 180 tablet; Refill: 2     Orders & Medications Meds ordered this encounter  Medications   Dextromethorphan-buPROPion ER (AUVELITY) 45-105 MG TBCR    Sig: TAKE 1 TABLET BY MOUTH IN THE MORNING AND IN THE EVENING    Dispense:  180 tablet    Refill:  2   No orders of the defined types were placed in this encounter.    No follow-ups on file.     [name], MD, [facility]   Primary Care Sports Medicine Primary Care and Sports Medicine at [facility]

## 2023-05-09 ENCOUNTER — Other Ambulatory Visit: Payer: Self-pay

## 2023-05-09 DIAGNOSIS — Z3009 Encounter for other general counseling and advice on contraception: Secondary | ICD-10-CM

## 2023-05-09 MED ORDER — ESTARYLLA 0.25-35 MG-MCG PO TABS
1.0000 | ORAL_TABLET | Freq: Every day | ORAL | 0 refills | Status: DC
Start: 2023-05-09 — End: 2023-06-09

## 2023-05-09 NOTE — Telephone Encounter (Signed)
Pt calling; needs refill of meds and has other questions.  [phone]  Called pt to let her know her bc has been refilled and to see what her questions were; pt states her questions have already been answered.  Pt states she missed three of her bcp and had unprotected sex and wanted the morning after pill which she is aware is otc now.

## 2023-06-09 ENCOUNTER — Encounter: Payer: Self-pay | Admitting: Licensed Practical Nurse

## 2023-06-09 ENCOUNTER — Ambulatory Visit (INDEPENDENT_AMBULATORY_CARE_PROVIDER_SITE_OTHER): Payer: BC Managed Care – PPO | Admitting: Licensed Practical Nurse

## 2023-06-09 VITALS — BP 133/81 | HR 86 | Ht 63.0 in | Wt 155.7 lb

## 2023-06-09 DIAGNOSIS — Z01419 Encounter for gynecological examination (general) (routine) without abnormal findings: Secondary | ICD-10-CM | POA: Diagnosis not present

## 2023-06-09 DIAGNOSIS — Z3009 Encounter for other general counseling and advice on contraception: Secondary | ICD-10-CM

## 2023-06-09 MED ORDER — ESTARYLLA 0.25-35 MG-MCG PO TABS
1.0000 | ORAL_TABLET | Freq: Every day | ORAL | 3 refills | Status: DC
Start: 2023-06-09 — End: 2024-01-11

## 2023-06-09 NOTE — Progress Notes (Signed)
Gynecology Annual Exam   PCP: [name], MD  Chief Complaint:  Chief Complaint  Patient presents with   Gynecologic Exam    History of Present Illness: Patient is a 27 y.o. G1P0 presents for annual exam. The patient has no complaints today. She needs her birth control refilled. She does not desires a pregnancy for another 2-3 years.   LMP: Patient's last menstrual period was 05/21/2023 (approximate). Average Interval: regular, monthly Duration of flow: 7 days Heavy Menses: no Clots: no Intermenstrual Bleeding: no Postcoital Bleeding: no Dysmenorrhea: no  The patient is sexually active with 1 female. She currently uses OCP (estrogen/progesterone) for contraception. She denies dyspareunia.  The patient does not perform self breast exams.  There is no notable family history of breast or ovarian cancer in her family.  The patient wears seatbelts: yes.   The patient has regular exercise: yes.  Weight lifting  The patient reports current symptoms of depression.  Currently being treated for anxiety, has started a new medication lat last month, is doing "ok" Works as a Systems developer Lives with her boyfriend, feels safe PCP, Joseph Berkshire, had recent blood work Education officer, community last seen a few months ago Eye exam, a few years ago   Review of Systems: ROS see HPI   Past Medical History:  Patient Active Problem List   Diagnosis Date Noted   Lumbosacral radiculopathy 04/21/2023   Annual physical exam 01/04/2023   Biceps tendinitis, left 12/02/2022   Rotator cuff impingement syndrome, left 12/02/2022   Need for immunization against influenza 12/02/2022   Chronic nonintractable headache 07/27/2022   Chronic abdominal pain 07/27/2022   Biceps tendinitis, right 07/27/2022   Patellofemoral syndrome, bilateral 03/31/2022   Anxiety with depression 10/27/2020    Prior treatments: - Prozac - Cymbalta - Auvelity (current)    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 Contraception: OCP (estrogen/progesterone) Last Pap: Results were: NILM, 04/2022  Obstetric History: G1P0  Family History:  Family History  Problem Relation Age of Onset   Healthy Mother    Hypertension Father    Healthy Father    Hypertension Maternal Grandfather    Stroke Paternal Grandmother     Social History:  Social History   Socioeconomic History   Marital status: Single    Spouse name: Not on file   Number of children: Not on file   Years of education: Not on file   Highest education level: Not on file  Occupational History   Not on file  Tobacco Use   Smoking status: Never   Smokeless tobacco: Never  Vaping Use   Vaping Use: Never used  Substance and Sexual Activity   Alcohol use: No   Drug use: No   Sexual activity: Yes    Birth control/protection: Pill  Other Topics Concern   Not on file  Social History Narrative   Not on file   Social Determinants of Health   Financial Resource Strain: Not on file  Food Insecurity: Not on file  Transportation Needs: Not on file  Physical Activity: Not on file  Stress: Not on file  Social Connections: Not on file  Intimate Partner Violence: Not on file    Allergies:  Allergies  Allergen Reactions   Penicillins Rash and Hives   Amoxicillin Hives   Cefdinir Other (See Comments) and Hives   Cephalosporins Rash    Medications: Prior to Admission medications   Medication Sig Start Date  End Date Taking? Authorizing Provider  celecoxib (CELEBREX) 100 MG capsule TAKE 1 CAPSULE(100 MG) BY MOUTH TWICE DAILY AS NEEDED 12/23/22  Yes [name], MD  cetirizine (ZYRTEC) 10 MG tablet daily.   Yes [provider]  Dextromethorphan-buPROPion ER (AUVELITY) 45-105 MG TBCR TAKE 1 TABLET BY MOUTH IN THE MORNING AND IN THE EVENING 04/21/23  Yes [name], MD  ESTARYLLA 0.25-35 MG-MCG tablet Take 1 tablet by mouth daily. 05/09/23  Yes Quana Chamberlain, Courtney Heys, CNM  mometasone (NASONEX) 50 MCG/ACT nasal spray mometasone 50 mcg/actuation nasal spray  2 SPRAYS EACH NOSTRIL ONCE A DAY. LOOK AT TOES AND POINT TOWARDS EARS.   Yes [provider]  omeprazole (PRILOSEC) 20 MG capsule omeprazole 20 mg capsule,delayed release Patient not taking: Reported on 06/09/2023 07/12/20   [provider]  SUMAtriptan (IMITREX) 50 MG tablet Take 1 tablet (50 mg total) by mouth every 2 (two) hours as needed for migraine. May repeat in 2 hours if headache persists or recurs. Patient not taking: Reported on 06/09/2023 07/27/22   [name], MD    Physical Exam Vitals: Blood pressure 133/81, pulse 86, height 5\' 3"  (1.6 m), weight 155 lb 11.2 oz (70.6 kg), last menstrual period 05/21/2023, unknown if currently breastfeeding.  General: NAD HEENT: normocephalic, anicteric Thyroid: no enlargement, no palpable nodules Pulmonary: No increased work of breathing, CTAB Cardiovascular: RRR, distal pulses 2+ Breast: Breast symmetrical, no tenderness, no palpable nodules or masses, no skin or nipple retraction present, no nipple discharge.  No axillary or supraclavicular lymphadenopathy. Abdomen: NABS, soft, non-tender, non-distended.  Umbilicus without lesions.  No hepatomegaly, splenomegaly or masses palpable. No evidence of hernia  Genitourinary:  External: Normal external female genitalia.  Normal urethral meatus, normal Bartholin's and Skene's glands.    Vagina: Normal vaginal mucosa, no  evidence of prolapse.  Good tone  Cervix: spec exam deferred   Uterus: Non-enlarged, mobile, normal contour.  No CMT  Adnexa: ovaries non-enlarged, no adnexal masses  Rectal: deferred  Lymphatic: no evidence of inguinal lymphadenopathy Extremities: no edema, erythema, or tenderness Neurologic: Grossly intact Psychiatric: mood appropriate, affect full   Assessment: 27 y.o. G1P0 routine annual exam  Plan: Problem List Items Addressed This Visit   None Visit Diagnoses     Well woman exam    -  Primary   Counseling for birth control, oral contraceptives       Relevant Medications   ESTARYLLA 0.25-35 MG-MCG tablet       2) STI screening  wasoffered and declined  2)  ASCCP guidelines and rational discussed.  Patient opts for every 3 years screening interval  3) Contraception - the patient is currently using  OCP (estrogen/progesterone).  She is happy with her current form of contraception and plans to continue  4) Routine healthcare maintenance including cholesterol, diabetes screening discussed managed by PCP  5) RTC 1 year   Carie Caddy, CNM   [facility] 06/09/2023, 2:38 PM

## 2023-08-03 ENCOUNTER — Encounter: Payer: Self-pay | Admitting: Family Medicine

## 2023-08-03 ENCOUNTER — Telehealth: Payer: Self-pay | Admitting: Family Medicine

## 2023-08-03 NOTE — Telephone Encounter (Signed)
Copied from CRM [id number]. Topic: General - Other >> Aug 03, 2023  4:00 PM Dominique A wrote: Reason for CRM: Pt states that she started a new job in [facility] and is needing her Physical form filled out. Pt last physical was 01/04/2023. Pt states that she can drop the form off tomorrow to be filled out.

## 2023-08-03 NOTE — Telephone Encounter (Signed)
Last physical 12/2022

## 2023-08-04 ENCOUNTER — Other Ambulatory Visit: Payer: Self-pay

## 2023-08-04 DIAGNOSIS — Z111 Encounter for screening for respiratory tuberculosis: Secondary | ICD-10-CM

## 2023-08-04 NOTE — Telephone Encounter (Signed)
Called pt let her know we will complete the form. Pt needs TB tests. Ordered TB blood test for patient. Pt is aware we need to get her results back before we complete the form.  KP

## 2023-08-16 LAB — QUANTIFERON-TB GOLD PLUS
QuantiFERON Mitogen Value: >10 [IU]/mL
QuantiFERON Nil Value: 0.02 [IU]/mL
QuantiFERON TB1 Ag Value: 0.04 [IU]/mL
QuantiFERON TB2 Ag Value: 0.08 [IU]/mL
QuantiFERON-TB Gold Plus: NEGATIVE

## 2024-01-09 ENCOUNTER — Ambulatory Visit (INDEPENDENT_AMBULATORY_CARE_PROVIDER_SITE_OTHER): Payer: 59 | Admitting: Family Medicine

## 2024-01-09 ENCOUNTER — Encounter: Payer: Self-pay | Admitting: Family Medicine

## 2024-01-09 VITALS — BP 120/68 | HR 91 | Ht 63.0 in | Wt 172.2 lb

## 2024-01-09 DIAGNOSIS — F418 Other specified anxiety disorders: Secondary | ICD-10-CM | POA: Diagnosis not present

## 2024-01-09 DIAGNOSIS — Z136 Encounter for screening for cardiovascular disorders: Secondary | ICD-10-CM

## 2024-01-09 DIAGNOSIS — R7309 Other abnormal glucose: Secondary | ICD-10-CM

## 2024-01-09 DIAGNOSIS — E559 Vitamin D deficiency, unspecified: Secondary | ICD-10-CM

## 2024-01-09 DIAGNOSIS — Z0000 Encounter for general adult medical examination without abnormal findings: Secondary | ICD-10-CM | POA: Diagnosis not present

## 2024-01-09 DIAGNOSIS — L503 Dermatographic urticaria: Secondary | ICD-10-CM

## 2024-01-09 MED ORDER — CITALOPRAM HYDROBROMIDE 10 MG PO TABS: 10.0000 mg | ORAL_TABLET | Freq: Every day | ORAL | 0 refills | Status: DC

## 2024-01-09 NOTE — Progress Notes (Signed)
Annual Physical Exam Visit  Patient Information:  Patient ID: Anna Cook, female DOB: [birth year] Age: 29 y.o. MRN: [id number]   Subjective:   CC: Annual Physical Exam  HPI:  Anna Cook is here for their annual physical.  I reviewed the past medical history, family history, social history, surgical history, and allergies today and changes were made as necessary.  Please see the problem list section below for additional details.  Past Medical History: Past Medical History:  Diagnosis Date   Anxiety    Past Surgical History: Past Surgical History:  Procedure Laterality Date   COLONOSCOPY WITH PROPOFOL N/A 12/16/2016   Procedure: COLONOSCOPY WITH PROPOFOL;  Surgeon: Wyline Mood, MD;  Location: [facility];  Service: Endoscopy;  Laterality: N/A;   ESOPHAGOGASTRODUODENOSCOPY (EGD) WITH PROPOFOL N/A 12/16/2016   Procedure: ESOPHAGOGASTRODUODENOSCOPY (EGD) WITH PROPOFOL;  Surgeon: Wyline Mood, MD;  Location: [facility] END[facility];  Service: Endoscopy;  Laterality: N/A;   Family History: Family History  Problem Relation Age of Onset   Healthy Mother    Hypertension Father    Healthy Father    Hypertension Maternal Grandfather    Stroke Paternal Grandmother    Allergies: Allergies  Allergen Reactions   Penicillins Rash and Hives   Amoxicillin Hives   Cefdinir Other (See Comments) and Hives   Cephalosporins Rash   Health Maintenance: Health Maintenance  Topic Date Due   COVID-19 Vaccine (4 - 2024-25 season) 01/25/2024 (Originally 08/21/2023)   INFLUENZA VACCINE  03/19/2024 (Originally 07/21/2023)   Cervical Cancer Screening (Pap smear)  05/18/2025   DTaP/Tdap/Td (2 - Td or Tdap) 07/24/2030   Hepatitis C Screening  Completed   HIV Screening  Completed   HPV VACCINES  Aged Out    HM Colonoscopy   This patient has no relevant Health Maintenance data.    Medications: Current Outpatient Medications on File Prior to Visit  Medication Sig Dispense Refill   cetirizine  (ZYRTEC) 10 MG tablet daily.     ESTARYLLA 0.25-35 MG-MCG tablet Take 1 tablet by mouth daily. 90 tablet 3   celecoxib (CELEBREX) 100 MG capsule TAKE 1 CAPSULE(100 MG) BY MOUTH TWICE DAILY AS NEEDED 30 capsule 0   mometasone (NASONEX) 50 MCG/ACT nasal spray mometasone 50 mcg/actuation nasal spray  2 SPRAYS EACH NOSTRIL ONCE A DAY. LOOK AT TOES AND POINT TOWARDS EARS.     No current facility-administered medications on file prior to visit.    Objective:   Vitals:   01/09/24 0845  BP: 120/68  Pulse: 91  SpO2: 98%   Vitals:   01/09/24 0845  Weight: 172 lb 3.2 oz (78.1 kg)  Height: 5\' 3"  (1.6 m)   Body mass index is 30.5 kg/m.  General: Well Developed, well nourished, and in no acute distress.  Neuro: Alert and oriented x3, extra-ocular muscles intact, sensation grossly intact. Cranial nerves II through XII are grossly intact, motor, sensory, and coordinative functions are intact. HEENT: Normocephalic, atraumatic, neck supple, no masses, no lymphadenopathy, thyroid nonenlarged. Oropharynx, nasopharynx, external ear canals are unremarkable. Skin: Warm and dry, no rashes noted.  Cardiac: Regular rate and rhythm, no murmurs rubs or gallops. No peripheral edema. Pulses symmetric. Respiratory: Clear to auscultation bilaterally. Speaking in full sentences.  Abdominal: Soft, nontender, nondistended, positive bowel sounds, no masses, no organomegaly. Musculoskeletal: Stable, and with full range of motion.   Impression and Recommendations:   The patient was counselled, risk factors were discussed, and anticipatory guidance given.  Problem List Items Addressed This Visit  Anxiety with depression   Depression and Anxiety Patient reported discontinuation of Auvelity due to side effects including emotional lability and fatigue. Discussed treatment options. -Start Citalopram (Celexa) 10mg . -Check response at 6 weeks via video visit. -Continue with psychology visits. -Patient can set  up psychiatry visit through her psychology group and I have encouraged her to do so. We will manage over interim.      Relevant Medications   citalopram (CELEXA) 10 MG tablet   Dermographia   Dermographia Patient reported welts on back, arms, and legs when not taking allergy medication. -Continue current Zyrtec medication. -Consider referral to allergy specialist if symptoms worsen.      Healthcare maintenance - Primary   Relevant Orders   CBC   Comprehensive metabolic panel   Other Visit Diagnoses       Vitamin D deficiency       Relevant Orders   VITAMIN D 25 Hydroxy (Vit-D Deficiency, Fractures)     Screening for cardiovascular condition       Relevant Orders   Lipid panel     Abnormal glucose       Relevant Orders   Hemoglobin A1c        Orders & Medications Medications:  Meds ordered this encounter  Medications   citalopram (CELEXA) 10 MG tablet    Sig: Take 1 tablet (10 mg total) by mouth daily.    Dispense:  60 tablet    Refill:  0   Orders Placed This Encounter  Procedures   CBC   Comprehensive metabolic panel   Hemoglobin A1c   Lipid panel   VITAMIN D 25 Hydroxy (Vit-D Deficiency, Fractures)     Return in about 6 weeks (around 02/20/2024) for virtual visit.    [name], MD, [facility]   Primary Care Sports Medicine Primary Care and Sports Medicine at [facility]

## 2024-01-09 NOTE — Patient Instructions (Addendum)
-   Obtain fasting labs with orders provided (can have water or black coffee but otherwise no food or drink x 8 hours before labs) - Review information provided - Attend eye doctor annually, dentist every 6 months, work towards or maintain 30 minutes of moderate intensity physical activity at least 5 days per week, and consume a balanced diet - Return in 1 year for physical - Contact us for any questions between now and then   YOUR PLAN:  - Depression and Anxiety: Start Citalopram (Celexa) at a low dose. Check response in 6 weeks. Continue psychology visits. Look into Mindfulness. Recommend scheduling psychiatry visit.    - Dermographia: Continue daily allergy medication. Possible referral to an allergy specialist if symptoms worsen.  - Weight Management: Reduce protein supplement intake. Track calories, focus on stress reduction.

## 2024-01-09 NOTE — Assessment & Plan Note (Addendum)
Depression and Anxiety Patient reported discontinuation of Auvelity due to side effects including emotional lability and fatigue. Discussed treatment options. -Start Citalopram (Celexa) 10mg . -Check response at 6 weeks via video visit. -Continue with psychology visits. -Patient can set up psychiatry visit through her psychology group and I have encouraged her to do so. We will manage over interim.

## 2024-01-09 NOTE — Assessment & Plan Note (Signed)
Dermographia Patient reported welts on back, arms, and legs when not taking allergy medication. -Continue current Zyrtec medication. -Consider referral to allergy specialist if symptoms worsen.

## 2024-01-10 LAB — COMPREHENSIVE METABOLIC PANEL
ALT: 16 [IU]/L (ref 0–32)
AST: 15 [IU]/L (ref 0–40)
Albumin: 4.1 g/dL (ref 4.0–5.0)
Alkaline Phosphatase: 48 [IU]/L (ref 44–121)
BUN/Creatinine Ratio: 13 (ref 9–23)
BUN: 15 mg/dL (ref 6–20)
Bilirubin Total: 0.4 mg/dL (ref 0.0–1.2)
CO2: 23 mmol/L (ref 20–29)
Calcium: 9.3 mg/dL (ref 8.7–10.2)
Chloride: 104 mmol/L (ref 96–106)
Creatinine, Ser: 1.2 mg/dL — ABNORMAL HIGH (ref 0.57–1.00)
Globulin, Total: 2.3 g/dL (ref 1.5–4.5)
Glucose: 96 mg/dL (ref 70–99)
Potassium: 4.5 mmol/L (ref 3.5–5.2)
Sodium: 139 mmol/L (ref 134–144)
Total Protein: 6.4 g/dL (ref 6.0–8.5)
eGFR: 64 mL/min/{1.73_m2} (ref >59)

## 2024-01-10 LAB — CBC
Hematocrit: 41 % (ref 34.0–46.6)
Hemoglobin: 13.6 g/dL (ref 11.1–15.9)
MCH: 29.8 pg (ref 26.6–33.0)
MCHC: 33.2 g/dL (ref 31.5–35.7)
MCV: 90 fL (ref 79–97)
Platelets: 287 10*3/uL (ref 150–450)
RBC: 4.56 x10E6/uL (ref 3.77–5.28)
RDW: 11.5 % — ABNORMAL LOW (ref 11.7–15.4)
WBC: 6.1 10*3/uL (ref 3.4–10.8)

## 2024-01-10 LAB — VITAMIN D 25 HYDROXY (VIT D DEFICIENCY, FRACTURES): Vit D, 25-Hydroxy: 28.6 ng/mL — ABNORMAL LOW (ref 30.0–100.0)

## 2024-01-10 LAB — LIPID PANEL
Chol/HDL Ratio: 2.8 {ratio} (ref 0.0–4.4)
Cholesterol, Total: 174 mg/dL (ref 100–199)
HDL: 62 mg/dL (ref >39)
LDL Chol Calc (NIH): 98 mg/dL (ref 0–99)
Triglycerides: 73 mg/dL (ref 0–149)
VLDL Cholesterol Cal: 14 mg/dL (ref 5–40)

## 2024-01-10 LAB — HEMOGLOBIN A1C
Est. average glucose Bld gHb Est-mCnc: 103 mg/dL
Hgb A1c MFr Bld: 5.2 % (ref 4.8–5.6)

## 2024-01-11 ENCOUNTER — Other Ambulatory Visit: Payer: Self-pay | Admitting: Family Medicine

## 2024-01-11 ENCOUNTER — Encounter: Payer: Self-pay | Admitting: Family Medicine

## 2024-01-11 ENCOUNTER — Other Ambulatory Visit: Payer: Self-pay

## 2024-01-11 DIAGNOSIS — Z3009 Encounter for other general counseling and advice on contraception: Secondary | ICD-10-CM

## 2024-01-11 DIAGNOSIS — E559 Vitamin D deficiency, unspecified: Secondary | ICD-10-CM

## 2024-01-11 MED ORDER — ESTARYLLA 0.25-35 MG-MCG PO TABS: 1.0000 | ORAL_TABLET | Freq: Every day | ORAL | 3 refills | Status: DC

## 2024-01-11 MED ORDER — NORGESTIMATE-ETH ESTRADIOL 0.25-35 MG-MCG PO TABS: 1.0000 | ORAL_TABLET | Freq: Every day | ORAL | 11 refills | Status: DC

## 2024-01-11 MED ORDER — VITAMIN D (ERGOCALCIFEROL) 1.25 MG (50000 UNIT) PO CAPS
50000.0000 [IU] | ORAL_CAPSULE | ORAL | 0 refills | Status: DC
Start: 2024-01-11 — End: 2024-02-09

## 2024-02-09 ENCOUNTER — Telehealth: Payer: Self-pay | Admitting: Family Medicine

## 2024-02-09 ENCOUNTER — Telehealth (INDEPENDENT_AMBULATORY_CARE_PROVIDER_SITE_OTHER): Payer: 59 | Admitting: Family Medicine

## 2024-02-09 ENCOUNTER — Encounter: Payer: Self-pay | Admitting: Family Medicine

## 2024-02-09 DIAGNOSIS — J0140 Acute pansinusitis, unspecified: Secondary | ICD-10-CM | POA: Insufficient documentation

## 2024-02-09 MED ORDER — AZITHROMYCIN 250 MG PO TABS: ORAL_TABLET | ORAL | 0 refills | Status: AC

## 2024-02-09 NOTE — Progress Notes (Signed)
 Primary Care / Sports Medicine Virtual Visit  Patient Information:  Patient ID: Anna Cook, female DOB: [birth year] Age: 28 y.o. MRN: [id number]   Anna Cook is a pleasant 28 y.o. female presenting with the following:  Chief Complaint  Patient presents with   Cough    Patient has cough and sore throat since yesterday morning 02/08/24. It was worse this morning when patient got up. She wold like some relief from the sore throat. Patient has taken dayquil and using cough drops but no relief. Her throat is hruting on bith sides and she does state both of her ears are itching on the inside.     Review of Systems: No fevers, chills, night sweats, weight loss, chest pain, or shortness of breath.   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    citalopram (CELEXA) 10 MG tablet Take 1 tablet (10 mg total) by mouth daily.    norgestimate-ethinyl estradiol (ORTHO-CYCLEN) 0.25-35 MG-MCG tablet Take 1 tablet by mouth daily. Use as directed.    mometasone (NASONEX) 50 MCG/ACT nasal spray mometasone 50  mcg/actuation nasal spray  2 SPRAYS EACH NOSTRIL ONCE A DAY. LOOK AT TOES AND POINT TOWARDS EARS. (Patient not taking: Reported on 02/09/2024) 01/09/2024: PRN   [DISCONTINUED] Vitamin D, Ergocalciferol, (DRISDOL) 1.25 MG (50000 UNIT) CAPS capsule Take 1 capsule (50,000 Units total) by mouth every 7 (seven) days. Take for 8 total doses(weeks) (Patient not taking: Reported on 02/09/2024)    No facility-administered encounter medications on file as of 02/09/2024.   Past Surgical History:  Procedure Laterality Date   COLONOSCOPY WITH PROPOFOL N/A 12/16/2016   Procedure: COLONOSCOPY WITH PROPOFOL;  Surgeon: Wyline Mood, MD;  Location: [facility];  Service: Endoscopy;  Laterality: N/A;   ESOPHAGOGASTRODUODENOSCOPY (EGD) WITH PROPOFOL N/A 12/16/2016   Procedure: ESOPHAGOGASTRODUODENOSCOPY (EGD) WITH PROPOFOL;  Surgeon: Wyline Mood, MD;  Location: [facility];  Service: Endoscopy;  Laterality: N/A;    Virtual Visit via MyChart Video:   I connected with Lorry Franzoni on 02/09/24 via MyChart Video and verified that I am speaking with the correct person using appropriate identifiers.   The limitations, risks, security and privacy concerns of performing an evaluation and management service by MyChart Video, including the higher likelihood of inaccurate diagnoses and treatments, and the availability of in person appointments were reviewed. The possible need of an additional face-to-face encounter for complete and high quality delivery of care was discussed. The patient was also made aware that there may be a patient responsible charge related to this service. The patient expressed understanding and wishes to proceed.  Provider location is in medical facility. Patient  location is at their home, different from provider location. People involved in care of the patient during this telehealth encounter were myself, my nurse/medical assistant, and my front office/scheduling team member.  Objective findings:    General: Speaking full sentences, no audible heavy breathing. Sounds alert and appropriately interactive. Well-appearing. Face symmetric. Extraocular movements intact. Pupils equal and round. No nasal flaring or accessory muscle use visualized.  Independent interpretation of notes and tests performed by another provider:   None  Pertinent History, Exam, Impression, and Recommendations:   Problem List Items Addressed This Visit     Acute non-recurrent pansinusitis - Primary   History of Present Illness Anna Cook is a 28 year old female who presents with sore throat, cough, and congestion.  She has been experiencing a sore throat, persistent cough, and congestion since early yesterday morning around 4 AM. These symptoms have prevented her from sleeping and have worsened today. No fever or chills are present, although she feels hot, which she attributes possibly to the temperature in her house. She denies any difficulty breathing, but notes chest tightness when coughing.  She experiences even pressure around her sinuses on both sides of her face and reports internal itching and pain in her ears, possibly due to congestion.  She has been using DayQuil and cough drops without significant relief. Her medical history includes allergies to penicillin, amoxicillin, cefdinir, and cephalosporin, which cause hives.  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  Results LABS - home tests COVID-19: negative (02/08/2024) Influenza: negative (02/08/2024)  Assessment and Plan Sinusitis Symptoms include throat pain, cough, congestion, and sinus pressure. No fever reported. Negative for COVID and flu. Allergic to penicillin, amoxicillin, cefdinir, cephalosporin. -Prescribe azithromycin to take for the full course. -Advise use of Flonase, Mucinex, and saline nasal spray for symptom relief. -Recommend rest, hydration, and  maintaining nutrition. -Out of work note for tomorrow on MyChart.  General Health Maintenance -Advise patient to monitor symptoms and contact office if no improvement by next week.      Relevant Medications   azithromycin (ZITHROMAX) 250 MG tablet     Orders & Medications Medications:  Meds ordered this encounter  Medications   azithromycin (ZITHROMAX) 250 MG tablet    Sig: Take 2 tablets on day 1, then 1 tablet daily on days 2 through 5    Dispense:  6 tablet    Refill:  0   No orders of the defined types were placed in this encounter.    I discussed the above assessment and treatment plan with the patient. The patient was provided an opportunity to ask questions and all were answered. The patient agreed with the plan and demonstrated an understanding of the instructions.   The patient was advised to call back or seek an in-person evaluation if the symptoms worsen or if the condition fails to improve as anticipated.   I provided a total time of 20 minutes including both face-to-face and non-face-to-face time on 02/09/2024 inclusive of time utilized for medical chart review, information gathering, care coordination with staff, and documentation completion.    [name], MD, [facility]   Primary Care Sports Medicine Primary Care and Sports Medicine at [facility]

## 2024-02-09 NOTE — Telephone Encounter (Signed)
 Patients test are negative.

## 2024-02-09 NOTE — Patient Instructions (Signed)
 1. Sinusitis    - Symptoms: Throat pain, cough, congestion, and sinus pressure. No fever. Negative for COVID and flu. Allergic to penicillin, amoxicillin, cefdinir, and cephalosporin.    - Prescription: Azithromycin. Please take the full course as prescribed.    - Symptom Relief:      - Use Flonase as directed.      - Take Mucinex for congestion.      - Use saline nasal spray as needed.    - Additional Recommendations:      - Rest and stay hydrated.      - Maintain good nutrition.    - Work Note: An out-of-work note for tomorrow has been sent to [employer].  2. General Health Maintenance    - Monitor your symptoms.    - Contact our office if there is no improvement by next week.  Please reach out if you have any questions or need further assistance.

## 2024-02-09 NOTE — Telephone Encounter (Signed)
 Copied from CRM [id number]. Topic: Appointments - Appointment Scheduling >> Feb 09, 2024  9:25 AM Franchot Heidelberg wrote: Patient/patient representative is calling to schedule an appointment. Refer to attachments for appointment information.   Pt wants to know if PCP would prefer for her to come today for her appt. She has a cough/sore throat. Currently has a virtual.

## 2024-02-09 NOTE — Assessment & Plan Note (Signed)
 History of Present Illness Anna Cook is a 28 year old female who presents with sore throat, cough, and congestion.  She has been experiencing a sore throat, persistent cough, and congestion since early yesterday morning around 4 AM. These symptoms have prevented her from sleeping and have worsened today. No fever or chills are present, although she feels hot, which she attributes possibly to the temperature in her house. She denies any difficulty breathing, but notes chest tightness when coughing.  She experiences even pressure around her sinuses on both sides of her face and reports internal itching and pain in her ears, possibly due to congestion.  She has been using DayQuil and cough drops without significant relief. Her medical history includes allergies to penicillin, amoxicillin, cefdinir, and cephalosporin, which cause hives.  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  Results LABS - home tests COVID-19: negative (02/08/2024) Influenza: negative (02/08/2024)  Assessment and Plan Sinusitis Symptoms include throat pain, cough, congestion, and sinus pressure. No fever reported. Negative for COVID and flu. Allergic to penicillin, amoxicillin, cefdinir, cephalosporin. -Prescribe azithromycin to take for the full course. -Advise use of Flonase, Mucinex, and saline nasal spray for symptom relief. -Recommend rest, hydration, and maintaining nutrition. -Out of work note for tomorrow on MyChart.  General Health Maintenance -Advise patient to monitor symptoms and contact office if no improvement by next week.

## 2024-02-20 ENCOUNTER — Telehealth (INDEPENDENT_AMBULATORY_CARE_PROVIDER_SITE_OTHER): Payer: 59 | Admitting: Family Medicine

## 2024-02-20 ENCOUNTER — Encounter: Payer: Self-pay | Admitting: Family Medicine

## 2024-02-20 DIAGNOSIS — F418 Other specified anxiety disorders: Secondary | ICD-10-CM | POA: Diagnosis not present

## 2024-02-20 MED ORDER — HYDROXYZINE PAMOATE 25 MG PO CAPS: 25.0000 mg | ORAL_CAPSULE | Freq: Three times a day (TID) | ORAL | 0 refills | Status: DC | PRN

## 2024-02-20 MED ORDER — TRAZODONE HCL 50 MG PO TABS: 25.0000 mg | ORAL_TABLET | Freq: Every evening | ORAL | 0 refills | Status: DC | PRN

## 2024-02-20 MED ORDER — CITALOPRAM HYDROBROMIDE 20 MG PO TABS: 10.0000 mg | ORAL_TABLET | Freq: Every day | ORAL | 0 refills | Status: DC

## 2024-02-20 NOTE — Assessment & Plan Note (Signed)
 Anxiety with depression Doing well with current dose but still having anxiety episodes despite baseline control, has affected sleep as well. -Increase citalopram (Celexa) to 20 mg daily -Can use trazodone 25-50 mg nightly as needed for insomnia related to anxiety -Can use hydroxyzine (Vistaril) 1 tablet every 8 hours as needed for anxiety episodes -Continue with psychology visits through [facility] -Virtual video follow-up visit in 2 months -Contact us for any question/concerns between now and then

## 2024-02-20 NOTE — Patient Instructions (Signed)
-  Increase citalopram (Celexa) to 20 mg daily -Can use trazodone 25-50 mg nightly as needed for insomnia related to anxiety -Can use hydroxyzine (Vistaril) 1 tablet every 8 hours as needed for anxiety episodes -Continue with psychology visits through [facility] -Virtual video follow-up visit in 2 months -Contact us for any question/concerns between now and then

## 2024-02-20 NOTE — Progress Notes (Signed)
 Primary Care / Sports Medicine Virtual Visit  Patient Information:  Patient ID: Anna Cook, female DOB: Jan 02, 1996 Age: 28 y.o. MRN: [id number]   Anna Cook is a pleasant 28 y.o. female presenting with the following:  Chief Complaint  Patient presents with   Medical Management of Chronic Issues    Patient following up on her citalopram. She has been taking her medication as directed. She is still having some anxiety.     Review of Systems: No fevers, chills, night sweats, weight loss, chest pain, or shortness of breath.   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    hydrOXYzine (VISTARIL) 25 MG capsule Take 1 capsule (25 mg total) by mouth every 8 (eight) hours as needed for anxiety.    norgestimate-ethinyl estradiol (ORTHO-CYCLEN) 0.25-35 MG-MCG tablet Take 1 tablet by mouth daily. Use as directed.    traZODone (DESYREL) 50 MG tablet Take 0.5-1 tablets (25-50 mg total) by mouth at bedtime as needed for sleep.    [DISCONTINUED] citalopram (CELEXA) 10 MG tablet Take 1 tablet (10 mg total) by mouth daily.    citalopram (CELEXA) 20 MG tablet Take 0.5 tablets (10 mg total) by mouth daily.    [DISCONTINUED] celecoxib (CELEBREX) 100 MG capsule TAKE 1 CAPSULE(100 MG) BY MOUTH TWICE DAILY AS NEEDED (Patient not taking: Reported on 02/20/2024) 01/09/2024: PRN     [DISCONTINUED] mometasone (NASONEX) 50 MCG/ACT nasal spray mometasone 50 mcg/actuation nasal spray  2 SPRAYS EACH NOSTRIL ONCE A DAY. LOOK AT TOES AND POINT TOWARDS EARS. (Patient not taking: Reported on 02/20/2024) 01/09/2024: PRN   No facility-administered encounter medications on file as of 02/20/2024.   Past Surgical History:  Procedure Laterality Date   COLONOSCOPY WITH PROPOFOL N/A 12/16/2016   Procedure: COLONOSCOPY WITH PROPOFOL;  Surgeon: Wyline Mood, MD;  Location: [facility] ENDOSCOPY;  Service: Endoscopy;  Laterality: N/A;   ESOPHAGOGASTRODUODENOSCOPY (EGD) WITH PROPOFOL N/A 12/16/2016   Procedure: ESOPHAGOGASTRODUODENOSCOPY (EGD) WITH PROPOFOL;  Surgeon: Wyline Mood, MD;  Location: [facility] ENDOSCOPY;  Service: Endoscopy;  Laterality: N/A;    Virtual Visit via MyChart Video:   I connected with Anna Cook on 02/20/24 via MyChart Video and verified that I am speaking with the correct person using appropriate identifiers.   The limitations, risks, security and privacy concerns of performing an evaluation and management service by MyChart Video, including the higher likelihood of inaccurate diagnoses and treatments, and the availability of in person appointments were reviewed. The possible need of an additional face-to-face encounter for complete and high quality delivery of care was discussed. The patient was also made aware that there may be a patient responsible charge related to this service. The patient expressed understanding and wishes to proceed.  Provider location is in medical facility. Patient location is in their parked car, different from provider location. People involved in care of the patient during this telehealth encounter were myself, my nurse/medical assistant,  and my front office/scheduling team member.  Objective findings:   General: Speaking full sentences, no audible heavy breathing. Sounds alert and appropriately interactive. Well-appearing. Face symmetric.  Extraocular movements intact. Pupils equal and round. No nasal flaring or accessory muscle use visualized.  Independent interpretation of notes and tests performed by another provider:   None  Pertinent History, Exam, Impression, and Recommendations:   Problem List Items Addressed This Visit     Anxiety with depression - Primary   Anxiety with depression Doing well with current dose but still having anxiety episodes despite baseline control, has affected sleep as well. -Increase citalopram (Celexa) to 20 mg daily -Can use trazodone 25-50 mg nightly as needed for insomnia related to anxiety -Can use hydroxyzine (Vistaril) 1 tablet every 8 hours as needed for anxiety episodes -Continue with psychology visits through [facility] -Virtual video follow-up visit in 2 months -Contact us for any question/concerns between now and then      Relevant Medications   citalopram (CELEXA) 20 MG tablet   traZODone (DESYREL) 50 MG tablet   hydrOXYzine (VISTARIL) 25 MG capsule     Orders & Medications Medications:  Meds ordered this encounter  Medications   citalopram (CELEXA) 20 MG tablet    Sig: Take 0.5 tablets (10 mg total) by mouth daily.    Dispense:  90 tablet    Refill:  0   traZODone (DESYREL) 50 MG tablet    Sig: Take 0.5-1 tablets (25-50 mg total) by mouth at bedtime as needed for sleep.    Dispense:  30 tablet    Refill:  0   hydrOXYzine (VISTARIL) 25 MG capsule    Sig: Take 1 capsule (25 mg total) by mouth every 8 (eight) hours as needed for anxiety.    Dispense:  30 capsule    Refill:  0   No orders of the defined types were placed in this encounter.    I discussed the above assessment and treatment plan with the patient. The patient was provided an opportunity to ask questions and all were answered. The patient agreed with the plan and demonstrated an understanding of the instructions.   The patient was advised to call back or seek an in-person evaluation if the symptoms  worsen or if the condition fails to improve as anticipated.   I provided a total time of 30 minutes including both face-to-face and non-face-to-face time on 02/20/2024 inclusive of time utilized for medical chart review, information gathering, care coordination with staff, and documentation completion.    [name], MD, [facility]   Primary Care Sports Medicine Primary Care and Sports Medicine at [facility]

## 2024-03-09 ENCOUNTER — Other Ambulatory Visit: Payer: Self-pay | Admitting: Family Medicine

## 2024-03-09 DIAGNOSIS — E559 Vitamin D deficiency, unspecified: Secondary | ICD-10-CM

## 2024-03-12 NOTE — Telephone Encounter (Signed)
 D/C 02/09/24. Requested Prescriptions  Refused Prescriptions Disp Refills   Vitamin D, Ergocalciferol, (DRISDOL) 1.25 MG (50000 UNIT) CAPS capsule [Pharmacy Med Name: VITAMIN D2 1.25MG (50,000 UNIT)] 8 capsule 0    Sig: TAKE 1 CAPSULE (50,000 UNITS TOTAL) BY MOUTH EVERY 7 (SEVEN) DAYS. TAKE FOR 8 TOTAL DOSES(WEEKS)     Endocrinology:  Vitamins - Vitamin D Supplementation 2 Failed - 03/12/2024 12:19 PM      Failed - Manual Review: Route requests for 50,000 IU strength to the provider      Failed - Vitamin D in normal range and within 360 days    Vit D, 25-Hydroxy  Date Value Ref Range Status  01/09/2024 28.6 (L) 30.0 - 100.0 ng/mL Final    Comment:    Vitamin D deficiency has been defined by the Institute of Medicine and an Endocrine Society practice guideline as a level of serum 25-OH vitamin D less than 20 ng/mL (1,2). The Endocrine Society went on to further define vitamin D insufficiency as a level between 21 and 29 ng/mL (2). 1. IOM (Institute of Medicine). 2010. Dietary reference    intakes for calcium and D. [place]: The    [employer]. 2. Holick MF, Binkley [place], Bischoff-Ferrari HA, et al.    Evaluation, treatment, and prevention of vitamin D    deficiency: an Endocrine Society clinical practice    guideline. JCEM. 2011 Jul; 96(7):1911-30.          Passed - Ca in normal range and within 360 days    Calcium  Date Value Ref Range Status  01/09/2024 9.3 8.7 - 10.2 mg/dL Final         Passed - Valid encounter within last 12 months    Recent Outpatient Visits           2 months ago Healthcare maintenance   [facility] Primary Care & Sports Medicine at [facility], Ocie Bob, MD   10 months ago Lumbosacral radiculopathy   [facility] Primary Care & Sports Medicine at [facility], Ocie Bob, MD   11 months ago Anxiety with depression   [facility] Primary Care & Sports Medicine at [facility], Ocie Bob, MD   12 months  ago Acute non-recurrent frontal sinusitis   [place] Primary Care & Sports Medicine at [facility], Ocie Bob, MD   1 year ago Annual physical exam   [facility] Primary Care & Sports Medicine at [facility], Ocie Bob, MD

## 2024-03-14 ENCOUNTER — Other Ambulatory Visit: Payer: Self-pay | Admitting: Family Medicine

## 2024-03-14 DIAGNOSIS — F418 Other specified anxiety disorders: Secondary | ICD-10-CM

## 2024-03-15 NOTE — Telephone Encounter (Signed)
 Requested medications are due for refill today.  yes  Requested medications are on the active medications list.  yes  Last refill. 02/20/2024 #30 0 rf  Future visit scheduled.   no  Notes to clinic.  Both are new medications for this pt. Please review for refill.    Requested Prescriptions  Pending Prescriptions Disp Refills   hydrOXYzine (VISTARIL) 25 MG capsule [Pharmacy Med Name: HYDROXYZINE PAM 25 MG CAP] 90 capsule 1    Sig: Take 1 capsule (25 mg total) by mouth every 8 (eight) hours as needed for anxiety.     Ear, Nose, and Throat:  Antihistamines 2 Failed - 03/15/2024  5:28 PM      Failed - Cr in normal range and within 360 days    Creatinine, Ser  Date Value Ref Range Status  01/09/2024 1.20 (H) 0.57 - 1.00 mg/dL Final         Passed - Valid encounter within last 12 months    Recent Outpatient Visits           3 weeks ago Anxiety with depression   [place] Primary Care & Sports Medicine at [facility], Ocie Bob, MD   1 month ago Acute non-recurrent pansinusitis   [facility] Primary Care & Sports Medicine at [facility], Ocie Bob, MD               traZODone (DESYREL) 50 MG tablet [Pharmacy Med Name: TRAZODONE 50 MG TABLET] 90 tablet 1    Sig: TAKE 0.5-1 TABLETS BY MOUTH AT BEDTIME AS NEEDED FOR SLEEP.     Psychiatry: Antidepressants - Serotonin Modulator Passed - 03/15/2024  5:28 PM      Passed - Completed PHQ-2 or PHQ-9 in the last 360 days      Passed - Valid encounter within last 6 months    Recent Outpatient Visits           3 weeks ago Anxiety with depression   [place] Primary Care & Sports Medicine at [facility], Ocie Bob, MD   1 month ago Acute non-recurrent pansinusitis   [facility] Primary Care & Sports Medicine at [facility], Ocie Bob, MD

## 2024-03-30 ENCOUNTER — Other Ambulatory Visit: Payer: Self-pay | Admitting: Family Medicine

## 2024-03-30 DIAGNOSIS — F418 Other specified anxiety disorders: Secondary | ICD-10-CM

## 2024-04-02 ENCOUNTER — Telehealth: Payer: Self-pay

## 2024-04-02 NOTE — Telephone Encounter (Signed)
 PA completed waiting on insurance approval.  Key: [id number]  KP

## 2024-04-02 NOTE — Telephone Encounter (Signed)
 Not completed pt no longer taking medication.  KP

## 2024-04-02 NOTE — Telephone Encounter (Signed)
 Too soon for refill,  Requested Prescriptions  Pending Prescriptions Disp Refills   hydrOXYzine (VISTARIL) 25 MG capsule [Pharmacy Med Name: HYDROXYZINE PAM 25 MG CAP] 270 capsule 1    Sig: TAKE 1 CAPSULE (25 MG TOTAL) BY MOUTH EVERY 8 (EIGHT) HOURS AS NEEDED FOR ANXIETY.     Ear, Nose, and Throat:  Antihistamines 2 Failed - 04/02/2024  9:04 AM      Failed - Cr in normal range and within 360 days    Creatinine, Ser  Date Value Ref Range Status  01/09/2024 1.20 (H) 0.57 - 1.00 mg/dL Final         Passed - Valid encounter within last 12 months    Recent Outpatient Visits           1 month ago Anxiety with depression   [place] Primary Care & Sports Medicine at [facility], Dessie Flow, MD   1 month ago Acute non-recurrent pansinusitis   [facility] Primary Care & Sports Medicine at [facility] Dessie Flow, MD

## 2024-04-16 ENCOUNTER — Encounter: Payer: Self-pay | Admitting: Family Medicine

## 2024-04-17 NOTE — Telephone Encounter (Signed)
 Please review. Can we send in 20 MG daily?  KP

## 2024-04-18 ENCOUNTER — Other Ambulatory Visit: Payer: Self-pay

## 2024-04-18 DIAGNOSIS — F418 Other specified anxiety disorders: Secondary | ICD-10-CM

## 2024-04-18 MED ORDER — CITALOPRAM HYDROBROMIDE 20 MG PO TABS: 20.0000 mg | ORAL_TABLET | Freq: Every day | ORAL | 0 refills | Status: DC

## 2024-04-18 NOTE — Telephone Encounter (Signed)
 Please review. Pt is out of medication.  KP

## 2024-04-23 ENCOUNTER — Encounter: Payer: Self-pay | Admitting: Family Medicine

## 2024-07-06 ENCOUNTER — Encounter: Payer: Self-pay | Admitting: Family Medicine

## 2024-07-06 ENCOUNTER — Ambulatory Visit: Admitting: Family Medicine

## 2024-07-06 VITALS — BP 116/78 | HR 67 | Ht 63.0 in | Wt 166.8 lb

## 2024-07-06 DIAGNOSIS — K5909 Other constipation: Secondary | ICD-10-CM | POA: Diagnosis not present

## 2024-07-06 DIAGNOSIS — K648 Other hemorrhoids: Secondary | ICD-10-CM | POA: Diagnosis not present

## 2024-07-06 DIAGNOSIS — F418 Other specified anxiety disorders: Secondary | ICD-10-CM

## 2024-07-06 MED ORDER — HYDROCORTISONE 2.5 % EX OINT: TOPICAL_OINTMENT | CUTANEOUS | 0 refills | Status: AC

## 2024-07-06 MED ORDER — CITALOPRAM HYDROBROMIDE 40 MG PO TABS: 40.0000 mg | ORAL_TABLET | Freq: Every day | ORAL | 0 refills | Status: DC

## 2024-07-06 NOTE — Assessment & Plan Note (Signed)
 Hemorrhoidal symptoms - Persistent symptomatic internal hemorrhoids with significant discomfort - Flare-ups occur approximately once a month - Symptoms exacerbated by straining during bowel movements - Pain and difficulty with wiping during flare-ups - Periods of relief between flare-ups, but considerable discomfort during episodes - Symptoms began after colonoscopy and endoscopy during sophomore year of college, when internal hemorrhoids were diagnosed - Development of an external hemorrhoid around the same time as initial diagnosis  Pharmacologic management of hemorrhoids - Currently using nifedipine and lidocaine  ointment three times daily with minimal effectiveness - Previously used nitroglycerin ointment, discontinued due to headaches  Symptomatic hemorrhoids Chronic hemorrhoids with at least monthly flare-ups. Current treatment insufficient.  Procedures considered due to limited relief from ointments. - Continue nifedipine with lidocaine  ointment three times daily. - Prescribe prescription steroid ointment, use sparingly twice daily for maximum of 7 days, stop when symptoms resolve. - Refer to [facility] for evaluation and discussion of additional treatment options. - Advise starting fiber supplement such as Metamucil or Citrucel. - Recommend Miralax if fiber supplementation is insufficient. - Send MyChart message with information on fiber supplements and constipation management.

## 2024-07-06 NOTE — Assessment & Plan Note (Addendum)
 Constipation - History of constipation, believed to contribute to hemorrhoidal symptoms  Dietary habits - Picky eating habits affecting ability to follow dietary recommendations for fiber intake - Considering fiber supplements as an alternative to dietary fiber  Plan Constipation - Advise starting fiber supplement such as Metamucil or Citrucel. - Recommend Miralax if fiber supplementation is insufficient. - Send MyChart message with information on fiber supplements and constipation management.

## 2024-07-06 NOTE — Assessment & Plan Note (Signed)
-   Currently taking Celexa  milligram once daily for mood management - Trazodone , sleep medication used as needed with reported benefit  Mental health Increasing Celexa  considered for better control of an elevated GAD readings. - Increase Celexa  to 40 mg once daily. - Monitor for improvement over the next month to month and a half. - Advise to report any worsening symptoms. - Continue current trazodone  as needed.

## 2024-07-06 NOTE — Patient Instructions (Signed)
 Patient Plan for Post-Visit Guidance  1. Anxiety with Depression:    - Increase Celexa  to 40 mg once daily.    - Monitor for improvement over the next month to month and a half.    - Report any worsening symptoms.    - Continue taking trazodone  as needed for sleep.  2. Chronic Constipation:    - Start a fiber supplement such as Metamucil or Citrucel.    - Use Miralax if fiber supplementation is insufficient.    - Check MyChart for information on fiber supplements and constipation management.  See information on low-FODMAP eating plan attached to visit.  3. Symptomatic Hemorrhoids:    - Continue using nifedipine with lidocaine  ointment three times daily.    - Use prescription steroid ointment sparingly twice daily for a maximum of 7 days, stopping when symptoms resolve.    - Start a fiber supplement such as Metamucil or Citrucel.    - Use Miralax if fiber supplementation is insufficient.    - Refer to [facility] for evaluation and discussion of additional treatment options.    - Check MyChart for information on fiber supplements and constipation management.  Red Flags: - If you experience increased anxiety, depression, or any new symptoms, contact the office for further evaluation. - If hemorrhoid symptoms worsen or do not improve with treatment, contact the office for further evaluation.

## 2024-07-06 NOTE — Progress Notes (Signed)
 Primary Care / Sports Medicine Office Visit  Patient Information:  Patient ID: Anna Cook, female DOB: 10-Dec-1996 Age: 28 y.o. MRN: [id number]   Anna Cook is a pleasant 28 y.o. female presenting with the following:  Chief Complaint  Patient presents with   Hemorrhoids    Patient requesting referral.     Vitals:   07/06/24 0959  BP: 116/78  Pulse: 67  SpO2: 98%   Vitals:   07/06/24 0959  Weight: 166 lb 12.8 oz (75.7 kg)  Height: 5' 3 (1.6 m)   Body mass index is 29.55 kg/m.  No results found.   Independent interpretation of notes and tests performed by another provider:   None  Procedures performed:   None  Pertinent History, Exam, Impression, and Recommendations:   Problem List Items Addressed This Visit     Anxiety with depression   - Currently taking Celexa  milligram once daily for mood management - Trazodone , sleep medication used as needed with reported benefit  Mental health Increasing Celexa  considered for better control of an elevated GAD readings. - Increase Celexa  to 40 mg once daily. - Monitor for improvement over the next month to month and a half. - Advise to report any worsening symptoms. - Continue current trazodone  as needed.      Relevant Medications   citalopram  (CELEXA ) 40 MG tablet   Chronic constipation   Constipation - History of constipation, believed to contribute to hemorrhoidal symptoms  Dietary habits - Picky eating habits affecting ability to follow dietary recommendations for fiber intake - Considering fiber supplements as an alternative to dietary fiber  Plan Constipation - Advise starting fiber supplement such as Metamucil or Citrucel. - Recommend Miralax if fiber supplementation is insufficient. - Send MyChart message with information on fiber supplements and constipation management.       Relevant Orders   Ambulatory referral to Gastroenterology   Other hemorrhoids - Primary   Hemorrhoidal  symptoms - Persistent symptomatic internal hemorrhoids with significant discomfort - Flare-ups occur approximately once a month - Symptoms exacerbated by straining during bowel movements - Pain and difficulty with wiping during flare-ups - Periods of relief between flare-ups, but considerable discomfort during episodes - Symptoms began after colonoscopy and endoscopy during sophomore year of college, when internal hemorrhoids were diagnosed - Development of an external hemorrhoid around the same time as initial diagnosis  Pharmacologic management of hemorrhoids - Currently using nifedipine and lidocaine  ointment three times daily with minimal effectiveness - Previously used nitroglycerin ointment, discontinued due to headaches  Symptomatic hemorrhoids Chronic hemorrhoids with at least monthly flare-ups. Current treatment insufficient.  Procedures considered due to limited relief from ointments. - Continue nifedipine with lidocaine  ointment three times daily. - Prescribe prescription steroid ointment, use sparingly twice daily for maximum of 7 days, stop when symptoms resolve. - Refer to [facility] for evaluation and discussion of additional treatment options. - Advise starting fiber supplement such as Metamucil or Citrucel. - Recommend Miralax if fiber supplementation is insufficient. - Send MyChart message with information on fiber supplements and constipation management.      Relevant Medications   hydrocortisone 2.5 % ointment   Other Relevant Orders   Ambulatory referral to Gastroenterology     Orders & Medications Medications:  Meds ordered this encounter  Medications   hydrocortisone 2.5 % ointment    Sig: Apply to affected area BID. Apply sparingly for no longer than 1 week, stop when symptoms resolve.    Dispense:  30 g    Refill:  0   citalopram  (CELEXA ) 40 MG tablet    Sig: Take 1 tablet (40 mg total) by mouth daily.    Dispense:  90 tablet    Refill:  0    Orders Placed This Encounter  Procedures   Ambulatory referral to Gastroenterology     Return in about 7 months (around 01/20/2025) for CPE.     Selinda JINNY Ku, MD, [facility]   Primary Care Sports Medicine Primary Care and Sports Medicine at [facility]

## 2024-07-15 ENCOUNTER — Other Ambulatory Visit: Payer: Self-pay | Admitting: Family Medicine

## 2024-07-15 DIAGNOSIS — F418 Other specified anxiety disorders: Secondary | ICD-10-CM

## 2024-07-16 NOTE — Telephone Encounter (Signed)
 Requested Prescriptions  Refused Prescriptions Disp Refills   citalopram  (CELEXA ) 20 MG tablet [Pharmacy Med Name: CITALOPRAM  HBR 20 MG TABLET] 90 tablet 0    Sig: TAKE 1 TABLET BY MOUTH EVERY DAY     Psychiatry:  Antidepressants - SSRI Passed - 07/16/2024  4:58 PM      Passed - Completed PHQ-2 or PHQ-9 in the last 360 days      Passed - Valid encounter within last 6 months    Recent Outpatient Visits           1 week ago Other hemorrhoids   [place] [facility] Care & [facility] Medicine at [facility], Selinda PARAS, MD   4 months ago Anxiety with depression   [facility] Primary Care & Sports Medicine at [facility], Selinda PARAS, MD   5 months ago Acute non-recurrent pansinusitis   [facility] Primary Care & Sports Medicine at [facility], Selinda PARAS, MD       Future Appointments             In 6 months Ku, Selinda PARAS, MD [facility] Primary Care & Sports Medicine at [facility], [facility]

## 2024-08-14 ENCOUNTER — Encounter: Payer: Self-pay | Admitting: Family Medicine

## 2024-08-14 ENCOUNTER — Telehealth (INDEPENDENT_AMBULATORY_CARE_PROVIDER_SITE_OTHER): Admitting: Family Medicine

## 2024-08-14 DIAGNOSIS — T753XXA Motion sickness, initial encounter: Secondary | ICD-10-CM | POA: Diagnosis not present

## 2024-08-14 MED ORDER — SCOPOLAMINE 1 MG/3DAYS TD PT72SCOPOLAMINE 1 MG/3DAYS
1.0000 | MEDICATED_PATCH | TRANSDERMAL | 1 refills | Status: AC
Start: 2024-08-14 — End: ?

## 2024-08-19 DIAGNOSIS — T753XXA Motion sickness, initial encounter: Secondary | ICD-10-CM | POA: Insufficient documentation

## 2024-08-19 NOTE — Progress Notes (Signed)
 Primary Care / Sports Medicine Virtual Visit  Patient Information:  Patient ID: Anna Cook, female DOB: Jun 06, 1996 Age: 28 y.o. MRN: [id number]   Anna Cook is a pleasant 28 y.o. female presenting with the following:  Chief Complaint  Patient presents with   Kinetosis    Patient would like to discuss having some type of medication for motion sickness when she is riding with someone. She has noticed since she has not been driving as  much as she use to. She is having more frequency wit nausea and dizziness when riding in the passenger seat and when she is done with the car ride.     Review of Systems: No fevers, chills, night sweats, weight loss, chest pain, or shortness of breath.   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   citalopram  (CELEXA ) 40 MG tablet Take 1 tablet (40 mg total) by mouth daily.   hydrOXYzine  (VISTARIL ) 25 MG capsule TAKE 1 CAPSULE (25 MG TOTAL) BY MOUTH EVERY 8 (EIGHT) HOURS AS NEEDED FOR ANXIETY.   norgestimate -ethinyl estradiol  (ORTHO-CYCLEN) 0.25-35 MG-MCG tablet Take 1 tablet by mouth daily. Use as directed.   scopolamine  (TRANSDERM-SCOP) 1 MG/3DAYS Place 1 patch (1 mg total) onto the skin every 3 (three) days.   traZODone   (DESYREL ) 50 MG tablet TAKE 0.5-1 TABLETS BY MOUTH AT BEDTIME AS NEEDED FOR SLEEP.   hydrocortisone  2.5 % ointment Apply to affected area BID. Apply sparingly for no longer than 1 week, stop when symptoms resolve.   No facility-administered encounter medications on file as of 08/14/2024.   Past Surgical History:  Procedure Laterality Date   COLONOSCOPY WITH PROPOFOL  N/A 12/16/2016   Procedure: COLONOSCOPY WITH PROPOFOL ;  Surgeon: Ruel Kung, MD;  Location: [facility] ENDOSCOPY;  Service: Endoscopy;  Laterality: N/A;   ESOPHAGOGASTRODUODENOSCOPY (EGD) WITH PROPOFOL  N/A 12/16/2016   Procedure: ESOPHAGOGASTRODUODENOSCOPY (EGD) WITH PROPOFOL ;  Surgeon: Ruel Kung, MD;  Location: [facility] ENDOSCOPY;  Service: Endoscopy;  Laterality: N/A;    Virtual Visit via MyChart Video:   I connected with Anna Cook on 08/19/24 via MyChart Video and verified that I am speaking with the correct person using appropriate identifiers.   The limitations, risks, security and privacy concerns of performing an evaluation and management service by MyChart Video, including the higher likelihood of inaccurate diagnoses and treatments, and the availability of in person appointments were reviewed. The possible need of an additional face-to-face encounter for complete and high quality delivery of care was discussed. The patient was also made aware that there may be a patient responsible charge related to this service. The patient expressed understanding and wishes to proceed.  Provider location is in medical facility. Patient location is in their car, different from provider location. People involved in care of the patient during this telehealth encounter were myself, my nurse/medical assistant,  and my front office/scheduling team member.  Objective findings:   General: Speaking full sentences, no audible heavy breathing. Sounds alert and appropriately interactive. Well-appearing. Face symmetric. Extraocular movements intact.  Pupils equal and round. No nasal flaring or accessory muscle use visualized.  Independent interpretation of notes and tests performed by another provider:   None  Pertinent History, Exam, Impression, and Recommendations:   Problem List Items Addressed This Visit     Motion sickness - Primary   History of Present Illness Anna Cook is a 28 year old female who presents with motion sickness.  Motion sickness symptoms - Significant motion sickness as a passenger in a car, regardless of seating position, chronic issue - Symptoms include dizziness, headache, nausea, and occasional vomiting - Symptoms have been present since childhood, particularly during long car rides - Symptoms have become more noticeable recently - No difference in symptoms based on the driver or type of car  Symptom severity and impact - Severity of symptoms prevents enjoyment of car rides  Symptom relief measures - Finds relief by lying down and drinking water once returning home  Prior treatments - Has not tried many treatments - Previously used an over-the-counter pill (uncertain which) without significant relief - Has not been using any specific medications regularly for this issue  Assessment and Plan Motion sickness Chronic motion sickness with nausea, dizziness, headache, and vomiting when not driving. Symptoms worsen in the passenger seat and resolve with rest and hydration. Over-the-counter medications were ineffective. - Prescribed scopolamine  patch behind the ear every 72 hours as needed. - Advised purchasing meclizine and Dramamine for use 30 minutes before travel. - Instructed to avoid mixing scopolamine  with other motion sickness medications. - Recommended sitting in the front seat, avoiding reading or phone use, and focusing on the horizon during travel.        Orders & Medications Medications:  Meds ordered this encounter  Medications   scopolamine  (TRANSDERM-SCOP) 1 MG/3DAYS     Sig: Place 1 patch (1 mg total) onto the skin every 3 (three) days.    Dispense:  24 patch    Refill:  1   No orders of the defined types were placed in this encounter.    I discussed the above assessment and treatment plan with the patient. The patient was provided an opportunity to ask questions and all were answered. The patient agreed with the plan and demonstrated an understanding of the instructions.   The patient was advised to call back or seek an in-person evaluation if the symptoms worsen or if the condition fails to improve as anticipated.   I provided a total time of 30 minutes including both face-to-face and non-face-to-face time on 08/19/2024 inclusive of time utilized for medical chart review, information gathering, care coordination with staff, and documentation completion.    Selinda JINNY Ku, MD, [facility]   Primary Care Sports Medicine Primary Care and Sports Medicine at [facility]

## 2024-08-19 NOTE — Patient Instructions (Signed)
 Patient Action Plan  - Use the scopolamine  patch behind your ear every 72 hours as needed for motion sickness. - Purchase meclizine or Dramamine and take one dose 30 minutes before travel if needed. - Do not use scopolamine  at the same time as other motion sickness medications. - Sit in the front seat during travel, avoid reading or using your phone, and focus on the horizon. - Rest and stay hydrated if symptoms occur.  Red Flags: If you experience severe dizziness, confusion, vision changes, or trouble breathing, seek medical attention immediately.  If your symptoms get worse or you have new concerns, contact the office.

## 2024-08-19 NOTE — Assessment & Plan Note (Signed)
 History of Present Illness Kaja TYHESHA DUTSON is a 28 year old female who presents with motion sickness.  Motion sickness symptoms - Significant motion sickness as a passenger in a car, regardless of seating position, chronic issue - Symptoms include dizziness, headache, nausea, and occasional vomiting - Symptoms have been present since childhood, particularly during long car rides - Symptoms have become more noticeable recently - No difference in symptoms based on the driver or type of car  Symptom severity and impact - Severity of symptoms prevents enjoyment of car rides  Symptom relief measures - Finds relief by lying down and drinking water once returning home  Prior treatments - Has not tried many treatments - Previously used an over-the-counter pill (uncertain which) without significant relief - Has not been using any specific medications regularly for this issue  Assessment and Plan Motion sickness Chronic motion sickness with nausea, dizziness, headache, and vomiting when not driving. Symptoms worsen in the passenger seat and resolve with rest and hydration. Over-the-counter medications were ineffective. - Prescribed scopolamine  patch behind the ear every 72 hours as needed. - Advised purchasing meclizine and Dramamine for use 30 minutes before travel. - Instructed to avoid mixing scopolamine  with other motion sickness medications. - Recommended sitting in the front seat, avoiding reading or phone use, and focusing on the horizon during travel.

## 2024-09-24 ENCOUNTER — Encounter: Payer: Self-pay | Admitting: Family Medicine

## 2024-10-05 ENCOUNTER — Other Ambulatory Visit: Payer: Self-pay | Admitting: Family Medicine

## 2024-10-05 DIAGNOSIS — F418 Other specified anxiety disorders: Secondary | ICD-10-CM

## 2024-10-08 NOTE — Telephone Encounter (Signed)
 Requested Prescriptions  Pending Prescriptions Disp Refills   citalopram  (CELEXA ) 40 MG tablet [Pharmacy Med Name: CITALOPRAM  HBR 40 MG TABLET] 90 tablet 1    Sig: TAKE 1 TABLET BY MOUTH EVERY DAY     Psychiatry:  Antidepressants - SSRI Passed - 10/08/2024 11:16 AM      Passed - Completed PHQ-2 or PHQ-9 in the last 360 days      Passed - Valid encounter within last 6 months    Recent Outpatient Visits           1 month ago Motion sickness, initial encounter   [facility] Primary Care & Sports Medicine at [facility], Selinda PARAS, MD   3 months ago Other hemorrhoids   [place] Primary Care & Sports Medicine at [facility], Selinda PARAS, MD   7 months ago Anxiety with depression   [facility] Primary Care & Sports Medicine at [facility], Selinda PARAS, MD   8 months ago Acute non-recurrent pansinusitis   [facility] Primary Care & Sports Medicine at [facility], Selinda PARAS, MD       Future Appointments             In 4 months Ku, Selinda PARAS, MD [facility] Primary Care & Sports Medicine at [facility], [phone] [place]

## 2024-11-13 ENCOUNTER — Encounter: Payer: Self-pay | Admitting: Family Medicine

## 2024-11-13 ENCOUNTER — Ambulatory Visit
Admission: RE | Admit: 2024-11-13 | Discharge: 2024-11-13 | Disposition: A | Discharge status: Home / Self Care | Source: Ambulatory Visit | Attending: Family Medicine | Admitting: Family Medicine

## 2024-11-13 ENCOUNTER — Ambulatory Visit: Admitting: Family Medicine

## 2024-11-13 VITALS — BP 104/60 | HR 79 | Ht 63.0 in | Wt 169.0 lb

## 2024-11-13 DIAGNOSIS — R10813 Right lower quadrant abdominal tenderness: Secondary | ICD-10-CM | POA: Diagnosis not present

## 2024-11-13 LAB — POCT URINALYSIS DIPSTICK
Bilirubin, UA: NEGATIVE
Blood, UA: NEGATIVE
Glucose, UA: NEGATIVE
Ketones, UA: NEGATIVE
Leukocytes, UA: NEGATIVE
Nitrite, UA: NEGATIVE
Protein, UA: NEGATIVE
Spec Grav, UA: 1.015 (ref 1.010–1.025)
Urobilinogen, UA: 0.2 U/dL
pH, UA: 6 (ref 5.0–8.0)

## 2024-11-13 LAB — POCT URINE PREGNANCY: Preg Test, Ur: NEGATIVE

## 2024-11-13 MED ORDER — IOHEXOL 300 MG/ML  SOLN
100.0000 mL | Freq: Once | INTRAMUSCULAR | Status: AC | PRN
  Administered 2024-11-13: 100 mL via INTRAVENOUS

## 2024-11-13 NOTE — Progress Notes (Signed)
 Primary Care / Sports Medicine Office Visit  Patient Information:  Patient ID: Anna Cook, female DOB: Mar 15, 1996 Age: 28 y.o. MRN: [id number]   Anna Cook is a pleasant 28 y.o. female presenting with the following:  Chief Complaint  Patient presents with   Abdominal Pain    Right sided abdominal pain woke patient up early this morning. She broke out in a sweat due to the pain. Patient has taken ibuprofen but she still has pain. When she takes deep breaths and walking the pain is worse.     Vitals:   11/13/24 1309  BP: 104/60  Pulse: 79  SpO2: 99%   Vitals:   11/13/24 1309  Weight: 169 lb (76.7 kg)  Height: 5' 3 (1.6 m)   Body mass index is 29.94 kg/m.  No results found.   Discussed the use of AI scribe software for clinical note transcription with the patient, who gave verbal consent to proceed.   Independent interpretation of notes and tests performed by another provider:   None  Procedures performed:   None  Pertinent History, Exam, Impression, and Recommendations:   Problem List Items Addressed This Visit     Tenderness at McBurney point - Primary   History of Present Illness Anna Cook is a 28 year old female who presents with acute right lower quadrant abdominal pain.  Abdominal pain - Acute onset at approximately 2:30 AM, severe enough to wake her from sleep and cause screaming and weakness - Initially bilateral, quickly localized to the right lower quadrant - Constant and severe, not colicky - Duration of severe pain approximately two hours, then subsided enough for sleep - Pain persists upon waking, less intense but still present - Exacerbated by deep breaths, physical activity, and palpation - Described as similar to a period cramp, but not currently menstruating (last cycle completed 1-2 weeks ago) - No bulges or bumps in the abdominal area  Gastrointestinal symptoms - Decreased appetite over the past two days; feels hungry  but unable to eat - No use of medications to aid appetite - Daily bowel movements - During pain episode, difficulty passing stool and pain with straining - Significant bloating during pain episode - Despite regular bowel movements, felt bloated and unable to pass stool during pain episode  Constitutional symptoms - No measured fever - Intermittent sensations of feeling hot, feverish, or chills  Response to treatment - Over-the-counter ibuprofen taken for pain, with some relief  Physical Exam ABDOMEN: Abdomen is soft with tenderness at McBurney's point and rebound tenderness. Secondary tenderness in the lower quadrant. Non-tender in the right upper quadrant, left upper quadrant, and epigastrium. Hyperactive bowel sounds.  Assessment and Plan Suspected acute appendicitis with right lower quadrant abdominal pain and tenderness Acute right lower quadrant pain and tenderness at McBurney's point suggestive of appendicitis. Differential includes ovarian cyst and constipation, but appendicitis is primary concern. CT scan needed for confirmation. - Ordered stat CT scan of abdomen with IV contrast. - Obtained urine sample for culture to rule out UTI and assess HCG - both negative - Ordered blood work for infection markers, liver, and kidney function.  Decreased appetite and possible constipation Decreased appetite with bloating and difficulty passing stool. Constipation considered but appendicitis is primary concern. - Monitor bowel movements and appetite changes.      Relevant Orders   CT ABDOMEN PELVIS W CONTRAST   POCT urinalysis dipstick (Completed)   POCT urine pregnancy (Completed)   Comprehensive Metabolic Panel (  CMET)   CBC with Differential/Platelet   Amylase   Lipase     Orders & Medications Medications: No orders of the defined types were placed in this encounter.  Orders Placed This Encounter  Procedures   CT ABDOMEN PELVIS W CONTRAST   Comprehensive Metabolic Panel  (CMET)   CBC with Differential/Platelet   Amylase   Lipase   POCT urinalysis dipstick   POCT urine pregnancy     No follow-ups on file.     Anna JINNY Ku, MD, [facility]   Primary Care Sports Medicine Primary Care and Sports Medicine at [facility]

## 2024-11-13 NOTE — Assessment & Plan Note (Signed)
 History of Present Illness Anna Cook is a 28 year old female who presents with acute right lower quadrant abdominal pain.  Abdominal pain - Acute onset at approximately 2:30 AM, severe enough to wake her from sleep and cause screaming and weakness - Initially bilateral, quickly localized to the right lower quadrant - Constant and severe, not colicky - Duration of severe pain approximately two hours, then subsided enough for sleep - Pain persists upon waking, less intense but still present - Exacerbated by deep breaths, physical activity, and palpation - Described as similar to a period cramp, but not currently menstruating (last cycle completed 1-2 weeks ago) - No bulges or bumps in the abdominal area  Gastrointestinal symptoms - Decreased appetite over the past two days; feels hungry but unable to eat - No use of medications to aid appetite - Daily bowel movements - During pain episode, difficulty passing stool and pain with straining - Significant bloating during pain episode - Despite regular bowel movements, felt bloated and unable to pass stool during pain episode  Constitutional symptoms - No measured fever - Intermittent sensations of feeling hot, feverish, or chills  Response to treatment - Over-the-counter ibuprofen taken for pain, with some relief  Physical Exam ABDOMEN: Abdomen is soft with tenderness at McBurney's point and rebound tenderness. Secondary tenderness in the lower quadrant. Non-tender in the right upper quadrant, left upper quadrant, and epigastrium. Hyperactive bowel sounds.  Assessment and Plan Suspected acute appendicitis with right lower quadrant abdominal pain and tenderness Acute right lower quadrant pain and tenderness at McBurney's point suggestive of appendicitis. Differential includes ovarian cyst and constipation, but appendicitis is primary concern. CT scan needed for confirmation. - Ordered stat CT scan of abdomen with IV contrast. -  Obtained urine sample for culture to rule out UTI and assess HCG - both negative - Ordered blood work for infection markers, liver, and kidney function.  Decreased appetite and possible constipation Decreased appetite with bloating and difficulty passing stool. Constipation considered but appendicitis is primary concern. - Monitor bowel movements and appetite changes.

## 2024-11-13 NOTE — Patient Instructions (Signed)
 VISIT SUMMARY:  You came in today with severe right lower abdominal pain that started suddenly and woke you up at night. The pain was intense and constant, and you also experienced bloating and difficulty passing stool. We are primarily concerned about appendicitis and have ordered tests to confirm this.  YOUR PLAN:  SUSPECTED ACUTE APPENDICITIS: You have severe pain in the lower right side of your abdomen, which could be appendicitis. -We have ordered a CT scan of your abdomen with IV contrast to confirm the diagnosis. -We ordered blood tests to check for signs of infection and to assess your liver and kidney function. -We coordinated the scheduling of your CT scan with the imaging center.  DECREASED APPETITE AND POSSIBLE CONSTIPATION: You have had a decreased appetite and difficulty passing stool, which could be related to constipation, but appendicitis is our main concern right now. -Monitor your bowel movements and any changes in your appetite.

## 2024-11-14 ENCOUNTER — Telehealth: Payer: Self-pay

## 2024-11-14 ENCOUNTER — Ambulatory Visit: Payer: Self-pay | Admitting: Family Medicine

## 2024-11-14 LAB — COMPREHENSIVE METABOLIC PANEL WITH GFR
ALT: 10 IU/L (ref 0–32)
AST: 15 IU/L (ref 0–40)
Albumin: 4.2 g/dL (ref 4.0–5.0)
Alkaline Phosphatase: 52 IU/L (ref 41–116)
BUN/Creatinine Ratio: 14 (ref 9–23)
BUN: 16 mg/dL (ref 6–20)
Bilirubin Total: 0.7 mg/dL (ref 0.0–1.2)
CO2: 21 mmol/L (ref 20–29)
Calcium: 9.2 mg/dL (ref 8.7–10.2)
Chloride: 101 mmol/L (ref 96–106)
Creatinine, Ser: 1.12 mg/dL — ABNORMAL HIGH (ref 0.57–1.00)
Globulin, Total: 2.1 g/dL (ref 1.5–4.5)
Glucose: 84 mg/dL (ref 70–99)
Potassium: 4.1 mmol/L (ref 3.5–5.2)
Sodium: 137 mmol/L (ref 134–144)
Total Protein: 6.3 g/dL (ref 6.0–8.5)
eGFR: 69 mL/min/1.73 (ref >59)

## 2024-11-14 LAB — CBC WITH DIFFERENTIAL/PLATELET
Basophils Absolute: 0 x10E3/uL (ref 0.0–0.2)
Basos: 1 %
EOS (ABSOLUTE): 0.1 x10E3/uL (ref 0.0–0.4)
Eos: 1 %
Hematocrit: 42.6 % (ref 34.0–46.6)
Hemoglobin: 14 g/dL (ref 11.1–15.9)
Immature Grans (Abs): 0 x10E3/uL (ref 0.0–0.1)
Immature Granulocytes: 0 %
Lymphocytes Absolute: 2.9 x10E3/uL (ref 0.7–3.1)
Lymphs: 45 %
MCH: 30.4 pg (ref 26.6–33.0)
MCHC: 32.9 g/dL (ref 31.5–35.7)
MCV: 92 fL (ref 79–97)
Monocytes Absolute: 0.4 x10E3/uL (ref 0.1–0.9)
Monocytes: 7 %
Neutrophils Absolute: 2.8 x10E3/uL (ref 1.4–7.0)
Neutrophils: 45 %
Platelets: 284 x10E3/uL (ref 150–450)
RBC: 4.61 x10E6/uL (ref 3.77–5.28)
RDW: 12 % (ref 11.7–15.4)
WBC: 6.3 x10E3/uL (ref 3.4–10.8)

## 2024-11-14 LAB — AMYLASE: Amylase: 38 U/L (ref 31–110)

## 2024-11-14 LAB — LIPASE: Lipase: 34 U/L (ref 14–72)

## 2024-11-14 NOTE — Telephone Encounter (Signed)
 Please review and call patient in regards to scan. Thank you.  JM

## 2024-11-14 NOTE — Telephone Encounter (Signed)
 Copied from CRM #[id number]. Topic: Clinical - Lab/Test Results >> Nov 14, 2024 10:27 AM Anna Cook wrote: Reason for CRM: Patient is calling in for her CT results. Patients says she saw the results in MyChart and wanted some clarification on some things.

## 2024-11-19 ENCOUNTER — Telehealth: Payer: Self-pay

## 2024-11-19 NOTE — Telephone Encounter (Signed)
    Copied from CRM #[id number]. Topic: Clinical - Medical Advice >> Nov 19, 2024  9:41 AM Franky GRADE wrote: Reason for CRM: Patient was seen for a cyst on 11/13/2024 and referred to follow up with her OBGYN;however, they advised they would not be able to get the patient in until 12/19/2024 and she wanted to know if there is anyway Dr.Matthews can assist in getting her seen sooner with an OB.

## 2024-11-20 ENCOUNTER — Telehealth: Payer: Self-pay

## 2024-11-20 ENCOUNTER — Other Ambulatory Visit: Payer: Self-pay | Admitting: Family Medicine

## 2024-11-20 ENCOUNTER — Telehealth: Payer: Self-pay | Admitting: Family Medicine

## 2024-11-20 DIAGNOSIS — N83201 Unspecified ovarian cyst, right side: Secondary | ICD-10-CM | POA: Insufficient documentation

## 2024-11-20 NOTE — Telephone Encounter (Signed)
 Spoke with patient and informed her that Dr. Alvia ordered the US  for ruptured cyst. Someone should reach out to her in the next couple of days to get scheduled once insurance approves imaging. I also let her know to continue to call OB office and get on wait list/ cancellation list to be seen sooner than 12/19/24. If she begins to feel light headed or dizzy Dr. Alvia would like for her to go to the ER. She was in agreement with this plan.   JM

## 2024-11-20 NOTE — Telephone Encounter (Signed)
 Spoke with patient and informed her Dr. Alvia has ordered US  for ruptured cyst. Patient is also aware to be looking for a phone call to get this appt scheduled. She is also aware that she needs to call OB office to get on wait list to be seen sooner. She was in agreement with this plan. JM

## 2024-11-20 NOTE — Telephone Encounter (Signed)
 Copied from CRM #[id number]. Topic: Clinical - Lab/Test Results >> Nov 20, 2024  8:51 AM Anna Cook wrote: Reason for CRM: The patient has called to request that their most recent CT imaging from 11/13/24 be submitted to the OBGYN department at [facility] via fax at [phone]   Please contact the patient further if needed

## 2024-11-20 NOTE — Telephone Encounter (Signed)
 Reason for CRM: wants to know next steps about ovarian cyst since she can't get into see an obgyn until the end of December

## 2024-11-20 NOTE — Telephone Encounter (Signed)
 Copied from CRM #[id number]. Topic: Referral - Question >> Nov 20, 2024  8:42 AM Kendralyn S wrote:

## 2024-11-20 NOTE — Telephone Encounter (Signed)
 Copied from CRM #[id number]. Topic: Referral - Question >> Nov 20, 2024  8:42 AM Antony RAMAN wrote: Reason for CRM: wants to know next steps about ovarian cyst since she can't get into see an obgyn until the end of December

## 2024-11-27 ENCOUNTER — Ambulatory Visit: Admission: RE | Admit: 2024-11-27 | Discharge: 2024-11-27 | Attending: Family Medicine

## 2024-11-27 DIAGNOSIS — N83201 Unspecified ovarian cyst, right side: Secondary | ICD-10-CM

## 2024-12-03 ENCOUNTER — Ambulatory Visit: Payer: Self-pay | Admitting: Family Medicine

## 2024-12-04 ENCOUNTER — Encounter: Payer: Self-pay | Admitting: Obstetrics & Gynecology

## 2024-12-04 ENCOUNTER — Ambulatory Visit: Admitting: Obstetrics & Gynecology

## 2024-12-04 VITALS — BP 116/82 | HR 97 | Ht 63.0 in | Wt 171.0 lb

## 2024-12-04 DIAGNOSIS — N83201 Unspecified ovarian cyst, right side: Secondary | ICD-10-CM

## 2024-12-04 NOTE — Progress Notes (Signed)
° ° °  GYNECOLOGY PROGRESS NOTE  Subjective:    Patient ID: Anna Cook, female    DOB: [birth year], 28 y.o.   MRN: [id number]  HPI  Patient is a 28 y.o. monogamous P0 here today for several reasons. She had a right ovarian cyst rupture. She was seen by her primary care for RLQ pain and had a CT 11/13/2024 that showed a 3.8 cm right ovarian cyst. She then had a follow up pelvic ultrasound that showed that it had ruptured. She also wants to discuss contraception. She missed 2 pills recently. This happens on occasion. She is wondering about per risks of childbearing later in life.  The following portions of the patient's history were reviewed and updated as appropriate: allergies, current medications, past family history, past medical history, past social history, past surgical history, and problem list.  Review of Systems Pertinent items are noted in HPI.  She teaches special ed middle school. She has been monogamous for 5 years. Her pap was normal 2023 and UTD  Objective:   Blood pressure 116/82, pulse 97, height 5' 3 (1.6 m), weight 171 lb (77.6 kg), last menstrual period 11/27/2024, not currently breastfeeding. Body mass index is 30.29 kg/m. Well nourished, well hydrated White female, no apparent distress She is ambulating and conversing normally. Exam deferred   Assessment:   Ruptured ovarian cyst- asymptomic now, reassurance given We discussed normal ovarian cyst cycle with images. I told her that without contraception, she may have more frequent cysts, but these may not be painful. Questions about timing for pregnancy- she plans to discuss this with her partner. Rec MVI with folic acid She will need a pap smear next year.  Plan:  As above

## 2024-12-26 ENCOUNTER — Other Ambulatory Visit: Payer: Self-pay | Admitting: Family Medicine

## 2024-12-27 NOTE — Telephone Encounter (Signed)
 Requested Prescriptions  Pending Prescriptions Disp Refills   norgestimate -ethinyl estradiol  (ORTHO-CYCLEN) 0.25-35 MG-MCG tablet [Pharmacy Med Name: NORG-ETHIN ESTRA 0.25-0.035 MG] 84 tablet 0    Sig: TAKE 1 TABLET BY MOUTH DAILY. USE AS DIRECTED.     OB/GYN:  Contraceptives Passed - 12/27/2024 11:52 AM      Passed - Last BP in normal range    BP Readings from Last 1 Encounters:  12/04/24 116/82         Passed - Valid encounter within last 12 months    Recent Outpatient Visits           1 month ago Tenderness at McBurney point   [facility] Primary Care & Sports Medicine at [facility], Selinda PARAS, MD   4 months ago Motion sickness, initial encounter   [facility] Primary [facility] Medicine at [facility], Selinda PARAS, MD   5 months ago Other hemorrhoids   [place] Primary Care & Sports Medicine at [facility], Selinda PARAS, MD   10 months ago Anxiety with depression   [facility] Primary Care & Sports Medicine at [facility], Selinda PARAS, MD   10 months ago Acute non-recurrent pansinusitis   [facility] Primary Care & Sports Medicine at [facility], Selinda PARAS, MD       Future Appointments             In 1 month Ku, Selinda PARAS, MD [facility] Primary Care & Sports Medicine at [facility], [phone] [place]            Passed - Patient is not a smoker

## 2025-02-06 ENCOUNTER — Encounter: Admitting: Family Medicine
